# Patient Record
Sex: Male | Born: 1965 | Race: White | Hispanic: No | Marital: Married | Smoking: Never smoker
Health system: Southern US, Community
[De-identification: ages and names within clinical notes are randomized; demographics above are authoritative.]

## PROBLEM LIST (undated history)

## (undated) ENCOUNTER — Ambulatory Visit: Discharge status: Absent without leave | Payer: BLUE CROSS/BLUE SHIELD

## (undated) DIAGNOSIS — J40 Bronchitis, not specified as acute or chronic: Secondary | ICD-10-CM

## (undated) DIAGNOSIS — M1990 Unspecified osteoarthritis, unspecified site: Secondary | ICD-10-CM

## (undated) DIAGNOSIS — M720 Palmar fascial fibromatosis [Dupuytren]: Secondary | ICD-10-CM

## (undated) DIAGNOSIS — J45909 Unspecified asthma, uncomplicated: Secondary | ICD-10-CM

## (undated) HISTORY — PX: RHINOPLASTY: SUR1284

## (undated) HISTORY — DX: Palmar fascial fibromatosis (dupuytren): M72.0

## (undated) HISTORY — PX: TONSILLECTOMY: SUR1361

---

## 2005-01-12 DIAGNOSIS — E7800 Pure hypercholesterolemia, unspecified: Secondary | ICD-10-CM | POA: Insufficient documentation

## 2005-08-04 DIAGNOSIS — Z8042 Family history of malignant neoplasm of prostate: Secondary | ICD-10-CM | POA: Insufficient documentation

## 2005-08-10 ENCOUNTER — Ambulatory Visit: Payer: Self-pay | Admitting: Unknown Physician Specialty

## 2008-08-03 DIAGNOSIS — M6200 Separation of muscle (nontraumatic), unspecified site: Secondary | ICD-10-CM | POA: Insufficient documentation

## 2010-10-21 ENCOUNTER — Ambulatory Visit: Payer: Self-pay | Admitting: Internal Medicine

## 2011-04-29 ENCOUNTER — Ambulatory Visit: Payer: Self-pay

## 2011-05-16 ENCOUNTER — Ambulatory Visit: Payer: Self-pay

## 2012-01-07 ENCOUNTER — Ambulatory Visit: Payer: Self-pay | Admitting: Family Medicine

## 2012-02-12 ENCOUNTER — Ambulatory Visit: Payer: Self-pay | Admitting: Emergency Medicine

## 2012-07-30 ENCOUNTER — Ambulatory Visit: Payer: Self-pay | Admitting: Family Medicine

## 2014-01-30 ENCOUNTER — Ambulatory Visit: Payer: Self-pay | Admitting: Physician Assistant

## 2014-02-14 ENCOUNTER — Ambulatory Visit: Payer: Self-pay | Admitting: Physician Assistant

## 2014-03-21 ENCOUNTER — Ambulatory Visit: Payer: Self-pay | Admitting: Family Medicine

## 2014-07-26 ENCOUNTER — Other Ambulatory Visit: Payer: Self-pay

## 2014-07-26 ENCOUNTER — Ambulatory Visit
Admission: EM | Admit: 2014-07-26 | Discharge: 2014-07-26 | Disposition: A | Discharge status: Home / Self Care | Payer: 59 | Attending: Internal Medicine | Admitting: Internal Medicine

## 2014-07-26 ENCOUNTER — Ambulatory Visit: Payer: 59

## 2014-07-26 DIAGNOSIS — R079 Chest pain, unspecified: Secondary | ICD-10-CM | POA: Diagnosis not present

## 2014-07-26 DIAGNOSIS — J45909 Unspecified asthma, uncomplicated: Secondary | ICD-10-CM | POA: Insufficient documentation

## 2014-07-26 DIAGNOSIS — R091 Pleurisy: Secondary | ICD-10-CM

## 2014-07-26 DIAGNOSIS — R071 Chest pain on breathing: Secondary | ICD-10-CM

## 2014-07-26 HISTORY — DX: Unspecified asthma, uncomplicated: J45.909

## 2014-07-26 MED ORDER — IPRATROPIUM-ALBUTEROL 0.5-2.5 (3) MG/3ML IN SOLN
3.0000 mL | Freq: Once | RESPIRATORY_TRACT | Status: AC
  Administered 2014-07-26: 3 mL via RESPIRATORY_TRACT

## 2014-07-26 NOTE — ED Notes (Signed)
Started yesterday afternoon noticing "breathing heavy and hurts my left lung when I take a breath". "Feels the same as when I had pneumonia". Denies trauma. Pain is left lateral rib/chest. Denies sweating and does not radiate.

## 2014-07-26 NOTE — ED Provider Notes (Signed)
CSN: [id number]     Arrival date & time 07/26/14  0827 History   First MD Initiated Contact with Patient 07/26/14 [phone]     Chief Complaint  Patient presents with  . Chest Pain   HPI Patient is a 49 year old gentleman who presents with onset of left lower chest discomfort, about a 2/10, that he notices when he takes a deep breath. He had similar symptoms several months ago, when he had walking pneumonia.  Constant discomfort in the last day or so.  No trauma recalled.  Feels fine otherwise, no fever, no malaise.  Not coughing.  Past Medical History  Diagnosis Date  . Asthma    Past Surgical History  Procedure Laterality Date  . Tonsillectomy    . Rhinoplasty     Family History  Problem Relation Age of Onset  . Emphysema Mother    History  Substance Use Topics  . Smoking status: Never Smoker   . Smokeless tobacco: Not on file  . Alcohol Use: No    Review of Systems  All other systems reviewed and are negative.   Allergies  Review of patient's allergies indicates no known allergies.  Home Medications   Prior to Admission medications   Medication Sig Start Date End Date Taking? Authorizing Provider  Multiple Vitamin (MULTIVITAMIN) capsule Take 1 capsule by mouth daily.   Yes Historical Provider, MD   BP 102/73 mmHg  Pulse 84  Temp(Src) 98.4 F (36.9 C) (Oral)  Resp 16  Ht 5\' 6"  (1.676 m)  Wt 180 lb (81.647 kg)  BMI 29.07 kg/m2  SpO2 96% Physical Exam  Constitutional: He is oriented to person, place, and time. No distress.  Alert, nicely groomed  HENT:  Head: Atraumatic.  Eyes:  Conjugate gaze, no eye redness/drainage  Neck: Neck supple.  Cardiovascular: Normal rate and regular rhythm.   No murmur heard. Pulmonary/Chest: No respiratory distress. He has no wheezes. He has no rales.  Lungs clear, symmetric breath sounds  Abdominal: Soft. He exhibits no distension.  Musculoskeletal: Normal range of motion.  No leg swelling  Neurological: He is alert and  oriented to person, place, and time.  Skin: Skin is warm and dry.  No cyanosis  Nursing note and vitals reviewed.   ED Course  Procedures   ED ECG REPORT   Date: 07/26/2014  EKG Time: 7:37 PM  Rate: 80  Rhythm: normal sinus rhythm   Axis: 71  Intervals:no conduction delay  ST&T Change: no acute ST, T wave change  Narrative Interpretation: normal sinus rhythm, normal ECG I agree with the computer interpretation that ecg is normal today     Dg Chest 2 View  07/26/2014   CLINICAL DATA:  Left posterior mid chest pain when taking breath in, history of pneumonia in January of this year  EXAM: CHEST  2 VIEW  COMPARISON:  Chest x-ray of 03/21/2014  FINDINGS: No active infiltrate or effusion is seen. Mediastinal and hilar contours are unremarkable. The heart is within normal limits in size. No bony abnormality is seen.  IMPRESSION: No active cardiopulmonary disease.   Electronically Signed   By: Ivar Drape M.D.   On: 07/26/2014 09:47    duoneb breathing treatment was given at the urgent care, with minimal change in pleuritic chest discomfort..  MDM   1. Chest pain on respiration   2. Pleurisy without effusion    Differential dx includes pleurisy, musculoskeletal sources.  Trial of ibuprofen otc.  Recheck for persistent/worsening sx's.  Sherlene Shams, MD 07/26/14 [phone]

## 2014-07-26 NOTE — Discharge Instructions (Signed)
Your chest discomfort today seems most likely to be related to mild pleurisy (inflammation of covering around lung, often viral), or musculoskeletal irritation. Try taking advil for the next several days, up to iii tablets otc, 3-4 times daily. Recheck for worsening breathlessness, new fever >100.5, sustained/severe chest pain. Chest xray and ECG at the urgent care today were normal.

## 2014-12-22 ENCOUNTER — Ambulatory Visit
Admission: EM | Admit: 2014-12-22 | Discharge: 2014-12-22 | Disposition: A | Discharge status: Home / Self Care | Payer: 59 | Attending: Family Medicine | Admitting: Family Medicine

## 2014-12-22 ENCOUNTER — Ambulatory Visit (INDEPENDENT_AMBULATORY_CARE_PROVIDER_SITE_OTHER): Payer: 59

## 2014-12-22 ENCOUNTER — Encounter: Payer: Self-pay | Admitting: Emergency Medicine

## 2014-12-22 DIAGNOSIS — J40 Bronchitis, not specified as acute or chronic: Secondary | ICD-10-CM

## 2014-12-22 DIAGNOSIS — R059 Cough, unspecified: Secondary | ICD-10-CM

## 2014-12-22 DIAGNOSIS — R05 Cough: Secondary | ICD-10-CM

## 2014-12-22 DIAGNOSIS — J9811 Atelectasis: Secondary | ICD-10-CM

## 2014-12-22 MED ORDER — AZITHROMYCIN 500 MG PO TABS: ORAL_TABLET | ORAL | Status: DC

## 2014-12-22 MED ORDER — ALBUTEROL SULFATE HFA 108 (90 BASE) MCG/ACT IN AERS: 2.0000 | INHALATION_SPRAY | Freq: Four times a day (QID) | RESPIRATORY_TRACT | Status: DC | PRN

## 2014-12-22 MED ORDER — PREDNISONE 10 MG (21) PO TBPK: ORAL_TABLET | ORAL | Status: DC

## 2014-12-22 MED ORDER — HYDROCOD POLST-CPM POLST ER 10-8 MG/5ML PO SUER: 5.0000 mL | Freq: Two times a day (BID) | ORAL | Status: DC | PRN

## 2014-12-22 NOTE — Discharge Instructions (Signed)
Cough, Adult Coughing is a reflex that clears your throat and your airways. Coughing helps to heal and protect your lungs. It is normal to cough occasionally, but a cough that happens with other symptoms or lasts a long time may be a sign of a condition that needs treatment. A cough may last only 2-3 weeks (acute), or it may last longer than 8 weeks (chronic). CAUSES Coughing is commonly caused by:  Breathing in substances that irritate your lungs.  A viral or bacterial respiratory infection.  Allergies.  Asthma.  Postnasal drip.  Smoking.  Acid backing up from the stomach into the esophagus (gastroesophageal reflux).  Certain medicines.  Chronic lung problems, including COPD (or rarely, lung cancer).  Other medical conditions such as heart failure. HOME CARE INSTRUCTIONS  Pay attention to any changes in your symptoms. Take these actions to help with your discomfort:  Take medicines only as told by your health care provider.  If you were prescribed an antibiotic medicine, take it as told by your health care provider. Do not stop taking the antibiotic even if you start to feel better.  Talk with your health care provider before you take a cough suppressant medicine.  Drink enough fluid to keep your urine clear or pale yellow.  If the air is dry, use a cold steam vaporizer or humidifier in your bedroom or your home to help loosen secretions.  Avoid anything that causes you to cough at work or at home.  If your cough is worse at night, try sleeping in a semi-upright position.  Avoid cigarette smoke. If you smoke, quit smoking. If you need help quitting, ask your health care provider.  Avoid caffeine.  Avoid alcohol.  Rest as needed. SEEK MEDICAL CARE IF:   You have new symptoms.  You cough up pus.  Your cough does not get better after 2-3 weeks, or your cough gets worse.  You cannot control your cough with suppressant medicines and you are losing sleep.  You  develop pain that is getting worse or pain that is not controlled with pain medicines.  You have a fever.  You have unexplained weight loss.  You have night sweats. SEEK IMMEDIATE MEDICAL CARE IF:  You cough up blood.  You have difficulty breathing.  Your heartbeat is very fast.   This information is not intended to replace advice given to you by your health care provider. Make sure you discuss any questions you have with your health care provider.   Document Released: 06/27/2010 Document Revised: 09/19/2014 Document Reviewed: 03/07/2014 Elsevier Interactive Patient Education 2016 Else[employer]  Atelectasis, Adult Atelectasis is a collapse of the small air sacs in the lungs (alveoli). When this occurs, all or part of a lung collapses and becomes airless. It can be caused by various things and is a common problem after surgery. The severity of atelectasis will vary depending on the size of the area involved and the underlying cause of the condition. CAUSES  There are multiple causes for atelectasis:   Shallow breathing, particularly if there is an injury to your chest wall or abdomen that makes it painful to take a deep breath. This commonly occurs after surgery.  Obstruction of your airways (bronchi or bronchioles). This may be caused by a buildup of mucus (mucus plug), tumors, blood clots (pulmonary embolus), or inhaled foreign bodies. Mucus plugs occur when the lungs do not expand enough to get rid of mucus.  Outside pressure on the lung. This may be caused by  tumors, fluid (pleural effusion), or a leakage of air between the lung and rib cage (pneumothorax).   Infections such as pneumonia.  Scarring in lung tissue left over from previous infection or injury.  Some diseases such as cystic fibrosis. SIGNS AND SYMPTOMS  Often, atelectasis will have no symptoms. When symptoms occur, they include:  Shortness of breath.   Bluish color to your nails, lips, or mouth  (cyanosis). DIAGNOSIS  Your health care provider may suspect atelectasis based on symptoms and physical findings. A chest X-ray may be done to confirm the diagnosis. More specialized X-ray exams are sometimes required.  TREATMENT  Treatment will depend on the cause of the atelectasis. Treatment may include:  Purposeful coughing to loosen mucus plugs in the lungs.  Chest physiotherapy. This consists of clapping or percussion on the chest over the lungs to further loosen mucus plugs.  Postural drainage techniques. This involves positioning your body so your head is lower than your chest. [place] relaxed deep breathing whenever you are sitting down. A good technique is to take a few relaxed deep breaths each time a commercial comes on if you are watching television.  If you were given a deep breathing device (such as an incentive spirometer) or a mucus clearance device, use this regularly as directed by your health care provider.  Try to cough several times a day as directed by your health care provider.  Perform any chest physiotherapy or postural drainage techniques as directed by your health care provider. If necessary, have someone (such as a family member) assist you with these techniques.  When lying down, lie on the unaffected side to encourage mucus drainage.  Stay physically active as much as possible. SEEK IMMEDIATE MEDICAL CARE IF:   You develop increasing problems with your breathing.   You develop severe chest pain.   You develop severe coughing, or you cough up blood.   You have a fever or persistent symptoms for more than 2-3 days.   You have a fever and your symptoms suddenly get worse.  MAKE SURE YOU:  Understand these instructions.  Will watch your condition.  Will get help right away if you are not doing well or get worse.   This information is not intended to replace advice given to you by your health care provider. Make sure  you discuss any questions you have with your health care provider.   Document Released: 12/29/2004 Document Revised: 01/19/2014 Document Reviewed: 07/06/2012 Elsevier Interactive Patient Education 2016 [employer].  How to Use an Inhaler Using your inhaler correctly is very important. Good technique will make sure that the medicine reaches your lungs.  HOW TO USE AN INHALER:  Take the cap off the inhaler.  If this is the first time using your inhaler, you need to prime it. Shake the inhaler for 5 seconds. Release four puffs into the air, away from your face. Ask your doctor for help if you have questions.  Shake the inhaler for 5 seconds.  Turn the inhaler so the bottle is above the mouthpiece.  Put your pointer finger on top of the bottle. Your thumb holds the bottom of the inhaler.  Open your mouth.  Either hold the inhaler away from your mouth (the width of 2 fingers) or place your lips tightly around the mouthpiece. Ask your doctor which way to use your inhaler.  Breathe out as much air as possible.  Breathe in and push down on the bottle 1 time to  release the medicine. You will feel the medicine go in your mouth and throat.  Continue to take a deep breath in very slowly. Try to fill your lungs.  After you have breathed in completely, hold your breath for 10 seconds. This will help the medicine to settle in your lungs. If you cannot hold your breath for 10 seconds, hold it for as long as you can before you breathe out.  Breathe out slowly, through pursed lips. Whistling is an example of pursed lips.  If your doctor has told you to take more than 1 puff, wait at least 15-30 seconds between puffs. This will help you get the best results from your medicine. Do not use the inhaler more than your doctor tells you to.  Put the cap back on the inhaler.  Follow the directions from your doctor or from the inhaler package about cleaning the inhaler. If you use more than one inhaler,  ask your doctor which inhalers to use and what order to use them in. Ask your doctor to help you figure out when you will need to refill your inhaler.  If you use a steroid inhaler, always rinse your mouth with water after your last puff, gargle and spit out the water. Do not swallow the water. GET HELP IF:  The inhaler medicine only partially helps to stop wheezing or shortness of breath.  You are having trouble using your inhaler.  You have some increase in thick spit (phlegm). GET HELP RIGHT AWAY IF:  The inhaler medicine does not help your wheezing or shortness of breath or you have tightness in your chest.  You have dizziness, headaches, or fast heart rate.  You have chills, fever, or night sweats.  You have a large increase of thick spit, or your thick spit is bloody. MAKE SURE YOU:   Understand these instructions.  Will watch your condition.  Will get help right away if you are not doing well or get worse.   This information is not intended to replace advice given to you by your health care provider. Make sure you discuss any questions you have with your health care provider.   Document Released: 10/08/2007 Document Revised: 10/19/2012 Document Reviewed: 07/28/2012 Elsevier Interactive Patient Education 2016 Elsevier Inc.  Upper Respiratory Infection, Adult Most upper respiratory infections (URIs) are caused by a virus. A URI affects the nose, throat, and upper air passages. The most common type of URI is often called "the common cold." HOME CARE   Take medicines only as told by your doctor.  Gargle warm saltwater or take cough drops to comfort your throat as told by your doctor.  Use a warm mist humidifier or inhale steam from a shower to increase air moisture. This may make it easier to breathe.  Drink enough fluid to keep your pee (urine) clear or pale yellow.  Eat soups and other clear broths.  Have a healthy diet.  Rest as needed.  Go back to work when  your fever is gone or your doctor says it is okay.  You may need to stay home longer to avoid giving your URI to others.  You can also wear a face mask and wash your hands often to prevent spread of the virus.  Use your inhaler more if you have asthma.  Do not use any tobacco products, including cigarettes, chewing tobacco, or electronic cigarettes. If you need help quitting, ask your doctor. GET HELP IF:  You are getting worse, not better.  Your symptoms are not helped by medicine.  You have chills.  You are getting more short of breath.  You have brown or red mucus.  You have yellow or brown discharge from your nose.  You have pain in your face, especially when you bend forward.  You have a fever.  You have puffy (swollen) neck glands.  You have pain while swallowing.  You have white areas in the back of your throat. GET HELP RIGHT AWAY IF:   You have very bad or constant:  Headache.  Ear pain.  Pain in your forehead, behind your eyes, and over your cheekbones (sinus pain).  Chest pain.  You have long-lasting (chronic) lung disease and any of the following:  Wheezing.  Long-lasting cough.  Coughing up blood.  A change in your usual mucus.  You have a stiff neck.  You have changes in your:  Vision.  Hearing.  Thinking.  Mood. MAKE SURE YOU:   Understand these instructions.  Will watch your condition.  Will get help right away if you are not doing well or get worse.   This information is not intended to replace advice given to you by your health care provider. Make sure you discuss any questions you have with your health care provider.   Document Released: 06/17/2007 Document Revised: 05/15/2014 Document Reviewed: 04/05/2013 Elsevier Interactive Patient Education [employer].

## 2014-12-22 NOTE — ED Notes (Signed)
Patient c/o cough off and on for 6 weeks.

## 2014-12-22 NOTE — ED Provider Notes (Addendum)
CSN: [id number]     Arrival date & time 12/22/14  1031 History   First MD Initiated Contact with Patient 12/22/14 1154    Nurses notes were reviewed. Chief Complaint  Patient presents with  . Cough   Patient is here because of a cough for over 4 weeks. He states he finished a prescription for Omnicef about 1-2 weeks ago but has continued to have a recurrent cough that is worse at night. He has a history of exercise induced asthma he does not smoke. He states that the cough is usually nonproductive but has been very aggravating keeping him up at night as well.  (Consider location/radiation/quality/duration/timing/severity/associated sxs/prior Treatment) Patient is a 49 y.o. male presenting with cough. The history is provided by the patient. No language interpreter was used.  Cough Cough characteristics:  Productive and non-productive Sputum characteristics:  Green and gray Severity:  Moderate Duration:  4 weeks Timing:  Constant Progression:  Worsening Chronicity:  New Context: upper respiratory infection   Relieved by:  Nothing Ineffective treatments:  Cough suppressants Associated symptoms: rhinorrhea, shortness of breath, sinus congestion and wheezing   Associated symptoms: no chills and no fever     Past Medical History  Diagnosis Date  . Asthma    Past Surgical History  Procedure Laterality Date  . Tonsillectomy    . Rhinoplasty     Family History  Problem Relation Age of Onset  . Emphysema Mother    Social History  Substance Use Topics  . Smoking status: Never Smoker   . Smokeless tobacco: None  . Alcohol Use: No    Review of Systems  Constitutional: Negative for fever and chills.  HENT: Positive for rhinorrhea.   Respiratory: Positive for cough, shortness of breath and wheezing.     Allergies  Review of patient's allergies indicates no known allergies.  Home Medications   Prior to Admission medications   Medication Sig Start Date End Date Taking?  Authorizing Provider  albuterol (PROVENTIL HFA;VENTOLIN HFA) 108 (90 BASE) MCG/ACT inhaler Inhale 2 puffs into the lungs every 6 (six) hours as needed for wheezing or shortness of breath. 12/22/14   Frederich Cha, MD  azithromycin (ZITHROMAX) 500 MG tablet 1 tablet daily for the next 5 days 12/22/14   Frederich Cha, MD  chlorpheniramine-HYDROcodone [facility]) 10-8 MG/5ML SUER Take 5 mLs by mouth every 12 (twelve) hours as needed for cough. 12/22/14   Frederich Cha, MD  Multiple Vitamin (MULTIVITAMIN) capsule Take 1 capsule by mouth daily.    Historical Provider, MD  predniSONE (STERAPRED UNI-PAK 21 TAB) 10 MG (21) TBPK tablet Sig 6 tablet day 1, 5 tablets day 2, 4 tablets day 3,,3tablets day 4, 2 tablets day 5, 1 tablet day 6 take all tablets orally 12/22/14   Frederich Cha, MD   Meds Ordered and Administered this Visit  Medications - No data to display  BP 108/60 mmHg  Pulse 69  Temp(Src) 97 F (36.1 C) (Tympanic)  Resp 17  Ht 5\' 6"  (1.676 m)  Wt 180 lb (81.647 kg)  BMI 29.07 kg/m2  SpO2 98% No data found.   Physical Exam  Constitutional: He is oriented to person, place, and time. He appears well-developed and well-nourished.  HENT:  Head: Normocephalic and atraumatic.  Eyes: Conjunctivae are normal. Pupils are equal, round, and reactive to light.  Neck: Normal range of motion.  Cardiovascular: Normal rate, regular rhythm and normal heart sounds.   Pulmonary/Chest: Effort normal and breath sounds normal.  Musculoskeletal: Normal  range of motion.  Neurological: He is alert and oriented to person, place, and time.  Skin: Skin is warm and dry.  Psychiatric: He has a normal mood and affect.  Vitals reviewed.   ED Course  Procedures (including critical care time)  Labs Review Labs Reviewed - No data to display  Imaging Review Dg Chest 2 View  12/22/2014  CLINICAL DATA:  Cough for 6 weeks EXAM: CHEST  2 VIEW COMPARISON:  July 26, 2014 FINDINGS: There is mild left base  atelectatic change. There is no edema or consolidation. The heart size and pulmonary vascularity are normal. No adenopathy. No bone lesions. IMPRESSION: Slight left base atelectasis.  Lungs elsewhere clear. Electronically Signed   By: Lowella Grip III M.D.   On: 12/22/2014 13:35     Visual Acuity Review  Right Eye Distance:   Left Eye Distance:   Bilateral Distance:    Right Eye Near:   Left Eye Near:    Bilateral Near:         MDM   1. Bronchitis   2. Cough   3. Atelectasis      We'll place patient due to the atelectasis and persistent cough on Zithromax especially since he's been on Omnicef earlier, prednisone 6 day course, albuterol inhaler to be encouraged to use an test next 1 teaspoon twice a day. Recommend follow-up with PCP in 3-4 weeks for possible repeat chest x-ray and reevaluation.   Patient declines to albuterol inhaler. States that he's used before it makes him feel worse and is also cause some CNS side effects as well for him.    Frederich Cha, MD 12/22/14 1349  Frederich Cha, MD 12/22/14 [phone]

## 2015-01-02 DIAGNOSIS — M720 Palmar fascial fibromatosis [Dupuytren]: Secondary | ICD-10-CM | POA: Insufficient documentation

## 2015-04-20 ENCOUNTER — Encounter: Payer: Self-pay | Admitting: Family Medicine

## 2015-04-20 ENCOUNTER — Ambulatory Visit (INDEPENDENT_AMBULATORY_CARE_PROVIDER_SITE_OTHER): Payer: BLUE CROSS/BLUE SHIELD | Admitting: Family Medicine

## 2015-04-20 VITALS — BP 104/70 | HR 76 | Temp 97.8°F | Resp 16 | Ht 66.0 in | Wt 190.4 lb

## 2015-04-20 DIAGNOSIS — K219 Gastro-esophageal reflux disease without esophagitis: Secondary | ICD-10-CM | POA: Insufficient documentation

## 2015-04-20 DIAGNOSIS — J45909 Unspecified asthma, uncomplicated: Secondary | ICD-10-CM | POA: Insufficient documentation

## 2015-04-20 DIAGNOSIS — R350 Frequency of micturition: Secondary | ICD-10-CM | POA: Diagnosis not present

## 2015-04-20 DIAGNOSIS — J309 Allergic rhinitis, unspecified: Secondary | ICD-10-CM | POA: Insufficient documentation

## 2015-04-20 DIAGNOSIS — M722 Plantar fascial fibromatosis: Secondary | ICD-10-CM | POA: Insufficient documentation

## 2015-04-20 LAB — POCT URINALYSIS DIPSTICK
Bilirubin, UA: NEGATIVE
Blood, UA: NEGATIVE
Glucose, UA: NEGATIVE
Ketones, UA: NEGATIVE
Leukocytes, UA: NEGATIVE
Nitrite, UA: NEGATIVE
Protein, UA: NEGATIVE
Spec Grav, UA: >=1.03
Urobilinogen, UA: 0.2
pH, UA: 5

## 2015-04-20 MED ORDER — SULFAMETHOXAZOLE-TRIMETHOPRIM 800-160 MG PO TABS: 1.0000 | ORAL_TABLET | Freq: Two times a day (BID) | ORAL | Status: DC

## 2015-04-20 NOTE — Patient Instructions (Signed)
We will call you with the lab result.

## 2015-04-20 NOTE — Progress Notes (Signed)
Subjective:     Patient ID: Micheal Yoder, male   DOB: 1965/04/07, 50 y.o.   MRN: [id number]  HPI  Chief Complaint  Patient presents with  . Urinary Frequency    Patient comes in office today to address urinary frequency and urgency for the past 4 nights. Patient states that he has urinary frequency only during the evenings when he is laying down. Paitent states that when he voids it is very little. Patient denies any associated symptoms such as dysuria, hematuria, or incontinence.     Reports usual pattern is getting up 0-1 during the night. States he is married and monogamous. Paternal hx of prostate cancer at 67.   Review of Systems     Objective:   Physical Exam  Constitutional: He appears well-developed and well-nourished.  Genitourinary:  Prostate firm, non-tender, not enlarged.       Assessment:    1. Urinary frequency - POCT urinalysis dipstick - sulfamethoxazole-trimethoprim (BACTRIM DS,SEPTRA DS) 800-160 MG tablet; Take 1 tablet by mouth 2 (two) times daily.  Dispense: 20 tablet; Refill: 0 - PSA    Plan:    Further f/u pending lab results.

## 2015-04-23 LAB — PSA: Prostate Specific Ag, Serum: 1 ng/mL (ref 0.0–4.0)

## 2015-07-06 ENCOUNTER — Encounter: Payer: Self-pay | Admitting: *Deleted

## 2015-07-06 ENCOUNTER — Ambulatory Visit (INDEPENDENT_AMBULATORY_CARE_PROVIDER_SITE_OTHER): Payer: BLUE CROSS/BLUE SHIELD

## 2015-07-06 ENCOUNTER — Ambulatory Visit
Admission: EM | Admit: 2015-07-06 | Discharge: 2015-07-06 | Disposition: A | Discharge status: Home / Self Care | Payer: BLUE CROSS/BLUE SHIELD | Attending: Family Medicine | Admitting: Family Medicine

## 2015-07-06 DIAGNOSIS — R911 Solitary pulmonary nodule: Secondary | ICD-10-CM | POA: Diagnosis not present

## 2015-07-06 DIAGNOSIS — J40 Bronchitis, not specified as acute or chronic: Secondary | ICD-10-CM

## 2015-07-06 DIAGNOSIS — J4520 Mild intermittent asthma, uncomplicated: Secondary | ICD-10-CM | POA: Diagnosis not present

## 2015-07-06 DIAGNOSIS — R05 Cough: Secondary | ICD-10-CM | POA: Diagnosis not present

## 2015-07-06 DIAGNOSIS — R059 Cough, unspecified: Secondary | ICD-10-CM

## 2015-07-06 MED ORDER — BENZONATATE 100 MG PO CAPS: 100.0000 mg | ORAL_CAPSULE | Freq: Three times a day (TID) | ORAL | Status: DC

## 2015-07-06 MED ORDER — PREDNISONE 10 MG PO TABS: ORAL_TABLET | ORAL | Status: DC

## 2015-07-06 MED ORDER — LEVOFLOXACIN 500 MG PO TABS: 500.0000 mg | ORAL_TABLET | Freq: Every day | ORAL | Status: DC

## 2015-07-06 NOTE — ED Notes (Signed)
Patient states that he has had a cough and congestion for about 3 weeks

## 2015-07-06 NOTE — ED Provider Notes (Signed)
CSN: [id number]     Arrival date & time 07/06/15  0910 History   First MD Initiated Contact with Patient 07/06/15 [phone]     Chief Complaint  Patient presents with  . Cough   (Consider location/radiation/quality/duration/timing/severity/associated sxs/prior Treatment) Patient is a 50 y.o. male presenting with cough. The history is provided by the patient.  Cough Cough characteristics:  Productive Sputum characteristics:  Yellow Severity:  Moderate Onset quality:  Gradual Timing:  Intermittent Progression:  Worsening Smoker: no   Context: upper respiratory infection and weather changes   Relieved by:  Nothing Worsened by:  Nothing tried Associated symptoms: wheezing   Associated symptoms: no chest pain, no chills, no diaphoresis, no ear pain, no fever, no sinus congestion and no sore throat     Past Medical History  Diagnosis Date  . Asthma   . Dupuytren's disease    Past Surgical History  Procedure Laterality Date  . Tonsillectomy    . Rhinoplasty     Family History  Problem Relation Age of Onset  . Emphysema Mother    Social History  Substance Use Topics  . Smoking status: Never Smoker   . Smokeless tobacco: None  . Alcohol Use: No    Review of Systems  Constitutional: Negative for fever, chills and diaphoresis.  HENT: Positive for congestion and postnasal drip. Negative for ear discharge, ear pain, facial swelling and sore throat.   Eyes: Negative.   Respiratory: Positive for cough and wheezing. Negative for choking and chest tightness.   Cardiovascular: Negative for chest pain.  Gastrointestinal: Negative.   Genitourinary: Negative.   Musculoskeletal: Negative.   Psychiatric/Behavioral: Positive for agitation.       Certain meds    Allergies  Review of patient's allergies indicates no known allergies.  Home Medications   Prior to Admission medications   Medication Sig Start Date End Date Taking? Authorizing Provider  Triamcinolone Acetonide (NASACORT  ALLERGY 24HR NA) Place into the nose.   Yes Historical Provider, MD  benzonatate (TESSALON) 100 MG capsule Take 1 capsule (100 mg total) by mouth every 8 (eight) hours. 07/06/15   Juline Patch, MD  levofloxacin (LEVAQUIN) 500 MG tablet Take 1 tablet (500 mg total) by mouth daily. 07/06/15   Juline Patch, MD  Multiple Vitamin (MULTIVITAMIN) capsule Take 1 capsule by mouth daily.    Historical Provider, MD  predniSONE (DELTASONE) 10 MG tablet Taper 6,6,6,5,5,5,4,4,3,3,2,2,1,1 07/06/15   Juline Patch, MD  sulfamethoxazole-trimethoprim (BACTRIM DS,SEPTRA DS) 800-160 MG tablet Take 1 tablet by mouth 2 (two) times daily. 04/20/15   Carmon Ginsberg, PA   Meds Ordered and Administered this Visit  Medications - No data to display  BP 104/63 mmHg  Pulse 79  Temp(Src) 98 F (36.7 C) (Oral)  Ht 5\' 6"  (1.676 m)  Wt 180 lb (81.647 kg)  BMI 29.07 kg/m2  SpO2 99% No data found.   Physical Exam  Constitutional: He appears well-developed and well-nourished.  HENT:  Head: Normocephalic.  Right Ear: Tympanic membrane, external ear and ear canal normal.  Left Ear: Tympanic membrane, external ear and ear canal normal.  Nose: Nose normal.  Mouth/Throat: Oropharynx is clear and moist. No oropharyngeal exudate.  Eyes: Conjunctivae and EOM are normal. Pupils are equal, round, and reactive to light.  Neck: Normal range of motion. Neck supple. No JVD present.  Cardiovascular: Normal rate and normal heart sounds.  Exam reveals no gallop and no friction rub.   No murmur heard. Pulmonary/Chest: Effort normal and breath  sounds normal. He has no wheezes. He has no rales.  increased E?I  Abdominal: There is no tenderness. There is no rebound.  Lymphadenopathy:    He has no cervical adenopathy.  Skin: No rash noted. No erythema.  Psychiatric: He has a normal mood and affect.  Nursing note and vitals reviewed.   ED Course  Procedures (including critical care time)  Labs Review Labs Reviewed - No data to  display  Imaging Review Dg Chest 2 View  07/06/2015  CLINICAL DATA:  Cough and congestion for 3 weeks not getting better, pneumonia twice last year, history asthma EXAM: CHEST  2 VIEW COMPARISON:  12/22/2014 FINDINGS: Normal heart size, mediastinal contours, and pulmonary vascularity. Lungs clear. No pleural effusion or pneumothorax. Asymmetric density at lower lateral RIGHT chest, question related to osseous structures but nodule not excluded. Remaining osseous structures normal appearance. IMPRESSION: Vague opacity at lateral lower RIGHT chest cannot exclude pulmonary nodule; CT chest recommended to exclude pulmonary nodule. Electronically Signed   By: Lavonia Dana M.D.   On: 07/06/2015 10:28     Visual Acuity Review  Right Eye Distance:   Left Eye Distance:   Bilateral Distance:    Right Eye Near:   Left Eye Near:    Bilateral Near:         MDM   1. Bronchitis   2. Cough   3. Airway hyperreactivity, mild intermittent, uncomplicated   4. Pulmonary nodule    Meds ordered this encounter  Medications  . Triamcinolone Acetonide (NASACORT ALLERGY 24HR NA)    Sig: Place into the nose.  . levofloxacin (LEVAQUIN) 500 MG tablet    Sig: Take 1 tablet (500 mg total) by mouth daily.    Dispense:  10 tablet    Refill:  0  . predniSONE (DELTASONE) 10 MG tablet    Sig: Taper 6,6,6,5,5,5,4,4,3,3,2,2,1,1    Dispense:  53 tablet    Refill:  1  . benzonatate (TESSALON) 100 MG capsule    Sig: Take 1 capsule (100 mg total) by mouth every 8 (eight) hours.    Dispense:  21 capsule    Refill:  0    Juline Patch, MD 07/06/15 1045

## 2015-07-10 ENCOUNTER — Other Ambulatory Visit: Payer: Self-pay | Admitting: Family Medicine

## 2015-07-10 DIAGNOSIS — R9389 Abnormal findings on diagnostic imaging of other specified body structures: Secondary | ICD-10-CM

## 2015-07-17 ENCOUNTER — Ambulatory Visit
Admission: RE | Admit: 2015-07-17 | Discharge: 2015-07-17 | Disposition: A | Discharge status: Home / Self Care | Payer: BLUE CROSS/BLUE SHIELD | Source: Ambulatory Visit | Attending: Family Medicine | Admitting: Family Medicine

## 2015-07-17 DIAGNOSIS — R9389 Abnormal findings on diagnostic imaging of other specified body structures: Secondary | ICD-10-CM

## 2015-07-17 DIAGNOSIS — R938 Abnormal findings on diagnostic imaging of other specified body structures: Secondary | ICD-10-CM | POA: Diagnosis present

## 2015-08-14 ENCOUNTER — Encounter: Payer: Self-pay | Admitting: *Deleted

## 2015-08-14 ENCOUNTER — Ambulatory Visit
Admission: EM | Admit: 2015-08-14 | Discharge: 2015-08-14 | Disposition: A | Discharge status: Home / Self Care | Payer: BLUE CROSS/BLUE SHIELD | Attending: Emergency Medicine | Admitting: Emergency Medicine

## 2015-08-14 DIAGNOSIS — H6591 Unspecified nonsuppurative otitis media, right ear: Secondary | ICD-10-CM | POA: Diagnosis not present

## 2015-08-14 DIAGNOSIS — H6505 Acute serous otitis media, recurrent, left ear: Secondary | ICD-10-CM

## 2015-08-14 DIAGNOSIS — H6092 Unspecified otitis externa, left ear: Secondary | ICD-10-CM

## 2015-08-14 MED ORDER — CIPROFLOXACIN HCL 0.2 % OT SOLN: 0.2000 mL | Freq: Two times a day (BID) | OTIC | 0 refills | Status: AC

## 2015-08-14 MED ORDER — AMOXICILLIN-POT CLAVULANATE 875-125 MG PO TABS: 1.0000 | ORAL_TABLET | Freq: Two times a day (BID) | ORAL | 0 refills | Status: DC

## 2015-08-14 NOTE — ED Provider Notes (Signed)
CSN: [id number]     Arrival date & time 08/14/15  1859 History   First MD Initiated Contact with Patient 08/14/15 1945     Chief Complaint  Patient presents with  . 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 Asthma   . Dupuytren's disease    Past Surgical History:  Procedure Laterality Date  . RHINOPLASTY    . TONSILLECTOMY     Family History  Problem Relation Age of Onset  . Emphysema Mother    Social History  Substance Use Topics  . Smoking status: Never Smoker  . Smokeless tobacco: Never Used  . Alcohol use No    Review of Systems  Constitutional: Negative for chills and fever.  HENT: Positive for ear discharge and ear pain. Negative for congestion, dental problem, drooling, facial swelling, hearing loss, mouth sores, nosebleeds, postnasal drip, rhinorrhea, sinus pressure, sneezing, sore throat, tinnitus and trouble swallowing.   Eyes: Negative for pain and visual disturbance.  Respiratory: Negative for cough, shortness of breath and wheezing.   Cardiovascular: Negative for chest pain and palpitations.  Gastrointestinal: Negative for abdominal pain and vomiting.  Genitourinary: Negative for dysuria and hematuria.  Musculoskeletal: Negative for arthralgias, back pain and gait problem.  Skin: Negative for color change and rash.  Allergic/Immunologic: Positive for environmental allergies. Negative for food allergies.  Neurological: Negative for dizziness, seizures, syncope, facial asymmetry, light-headedness, numbness and headaches.   Psychiatric/Behavioral: Negative for sleep disturbance.  All other systems reviewed and are negative.   Allergies  Review of patient's allergies indicates no known allergies.  Home Medications   Prior to Admission medications   Medication Sig Start Date End Date Taking? Authorizing Provider  Multiple Vitamin (MULTIVITAMIN) capsule Take 1 capsule by mouth daily.   Yes Historical Provider, MD  Triamcinolone Acetonide (NASACORT ALLERGY 24HR NA) Place into the nose.   Yes Historical Provider, MD  amoxicillin-clavulanate (AUGMENTIN) 875-125 MG tablet Take 1 tablet by mouth every 12 (twelve) hours. 08/14/15   Olen Cordial, NP  Ciprofloxacin HCl 0.2 % otic solution Place 0.2 mLs into the left ear 2 (two) times daily. 08/14/15 08/21/15  Olen Cordial, NP   Meds Ordered and Administered this Visit  Medications - No data to display  BP 117/67 (BP Location: Left Arm)   Pulse 68   Temp 98.3 F (36.8 C) (Oral)   Resp 18   Ht 5\' 6"  (1.676 m)   Wt 180 lb (81.6 kg)   SpO2 100%   BMI 29.05 kg/m  No data found.   Physical Exam  Constitutional: He is oriented to person, place, and time. He appears well-developed and well-nourished. He is cooperative.  Non-toxic appearance. He does not have a sickly appearance. He does not appear ill. No distress.  HENT:  Head: Normocephalic and atraumatic.  Right Ear: Hearing, external ear and ear canal normal. A middle ear effusion is present.  Left Ear: Hearing and external ear normal. There is drainage and swelling. Tympanic membrane is injected, scarred, erythematous and bulging. A middle ear effusion is present.  Nose: Mucosal edema and rhinorrhea present. No nose  lacerations, sinus tenderness, nasal deformity, septal deviation or nasal septal hematoma. No epistaxis.  No foreign bodies. Right sinus exhibits no maxillary sinus tenderness and no frontal sinus tenderness. Left sinus exhibits no maxillary sinus tenderness and no frontal sinus tenderness.   Mouth/Throat: Uvula is midline and mucous membranes are normal. Mucous membranes are not pale, not dry and not cyanotic. He does not have dentures. No oral lesions. No trismus in the jaw. Normal dentition. No dental abscesses, uvula swelling, lacerations or dental caries. Posterior oropharyngeal edema and posterior oropharyngeal erythema present. No oropharyngeal exudate or tonsillar abscesses. Tonsils are 0 on the right. Tonsils are 0 on the left. No tonsillar exudate.  Left external auditory canal swollen erythema white discharge opaque coating walls; unable to visualize entire TM left but what I can see erythematous bulging with some scarring opaque air fluid level; right TM with air fluid level clear; bilateral nasal turbinates edema/erythema clear discharge; cobblestoning posterior pharynx; bilateral allergic shiners  Eyes: Conjunctivae, EOM and lids are normal. Pupils are equal, round, and reactive to light. Right eye exhibits no chemosis, no discharge, no exudate and no hordeolum. No foreign body present in the right eye. Left eye exhibits no chemosis, no discharge, no exudate and no hordeolum. No foreign body present in the left eye. Right conjunctiva is not injected. Right conjunctiva has no hemorrhage. Left conjunctiva is not injected. Left conjunctiva has no hemorrhage. No scleral icterus. Right eye exhibits normal extraocular motion and no nystagmus. Left eye exhibits normal extraocular motion and no nystagmus. Right pupil is round and reactive. Left pupil is round and reactive. Pupils are equal.  Neck: Normal range of motion. Neck supple. No tracheal tenderness, no spinous process tenderness and no muscular tenderness present. No neck rigidity. No tracheal deviation, no edema, no erythema and normal range of motion present. No thyromegaly present.  Cardiovascular: Normal rate, regular rhythm and normal heart sounds.   No murmur heard. Pulmonary/Chest: Effort normal and breath sounds normal. No  stridor. No respiratory distress. He has no decreased breath sounds. He has no wheezes. He has no rhonchi. He has no rales. He exhibits no tenderness.  Abdominal: Soft. There is no tenderness.  Musculoskeletal: He exhibits no edema, tenderness or deformity.       Right shoulder: Normal.       Left shoulder: Normal.       Right elbow: Normal.      Left elbow: Normal.       Right hip: Normal.       Left hip: Normal.       Right knee: Normal.       Left knee: Normal.       Right ankle: Normal.       Left ankle: Normal.       Cervical back: Normal.       Thoracic back: Normal.       Right hand: Normal.       Left hand: Normal.  Lymphadenopathy:       Head (right side): No submental, no submandibular, no tonsillar, no preauricular, no posterior auricular and no occipital adenopathy present.       Head (left side): No submental, no submandibular, no tonsillar, no preauricular, no posterior auricular and no occipital adenopathy present.    He has no cervical adenopathy.       Right cervical: No superficial cervical, no deep cervical and no posterior cervical adenopathy present.      Left cervical: No superficial cervical, no deep  cervical and no posterior cervical adenopathy present.  Neurological: He is alert and oriented to person, place, and time. He has normal strength. He is not disoriented. He displays no atrophy, no tremor and normal reflexes. No cranial nerve deficit or sensory deficit. He exhibits normal muscle tone. He displays no seizure activity. Coordination and gait normal. GCS eye subscore is 4. GCS verbal subscore is 5. GCS motor subscore is 6.  Skin: Skin is warm and dry. Capillary refill takes less than 2 seconds. No abrasion, no bruising, no burn, no ecchymosis, no laceration, no lesion, no petechiae and no rash noted. He is not diaphoretic. No cyanosis or erythema. No pallor. Nails show no clubbing.  Psychiatric: He has a normal mood and affect. His speech is normal and behavior  is normal. Judgment and thought content normal. Cognition and memory are normal.  Nursing note and vitals reviewed.   Urgent Care Course   Clinical Course    Procedures (including critical care time)  Labs Review Labs Reviewed - No data to display  Imaging Review No results found.     MDM   1. Otitis externa, left   2. Recurrent acute serous otitis media of left ear   3. Otitis media with effusion, right    Patient may use normal saline nasal spray as needed.  Consider antihistamine or nasal steroid use e.g. flonase 1 spray each nostril BID.  Avoid triggers if possible.  Shower prior to bedtime if exposed to triggers.  If allergic dust/dust mites recommend mattress/pillow covers/encasements; washing linens, vacuuming, sweeping, dusting weekly.  Call or return to clinic as needed if these symptoms worsen or fail to improve as anticipated.   Patient verbalized understanding of instructions, agreed with plan of care and had no further questions at this time.  P2:  Avoidance and hand washing.  Supportive treatment.   No evidence of invasive bacterial infection, non toxic and well hydrated.  This is most likely self limiting viral infection.  I do not see where any further testing or imaging is necessary at this time.   I will suggest supportive care, rest, good hygiene and encourage the patient to take adequate fluids.  The patient is to return to clinic or EMERGENCY ROOM if symptoms worsen or change significantly e.g. ear pain, fever, purulent discharge from ears or bleeding.  Exitcare handout on otitis media with effusion given to patient.  Patient verbalized agreement and understanding of treatment plan.    Treatment as ordered. augmentin 875mg  po BID x 10 days.  Tylenol 1000mg  po QID or advil 800mg  po TID prn pain.  Patient has both at home.  Symptomatic therapy suggested fluids, NSAIDs and rest.  May take Tylenol or Motrin for fevers.  Call or return to clinic as needed if these  symptoms worsen or fail to improve as anticipated. Exitcare handout on otitis media given to patient.  Patient verbalized agreement and understanding of treatment plan.   P2:  Hand washing  Treatment as ordered. Ciprofloxacin otic 0.43ml in left ear BID x 7 days. Avoid water in ears do not flush ears or put other fluids in ears e.g. Rubbing alcohol/oils/debrox/etc.  Symptomatic therapy suggested fluids, NSAIDs and rest.  May take Tylenol or Motrin for fevers.  Call or return to clinic as needed if these symptoms worsen or fail to improve as anticipated.    I do not see where any further testing or imaging is necessary at this time.   I will suggest supportive care, rest,  good hygiene and encourage the patient to take adequate fluids.  The patient is to return to clinic or EMERGENCY ROOM if symptoms worsen or change significantly e.g. ear pain, fever, purulent discharge from ears or bleeding.  Exitcare handout on otitis externa given to patient.  Patient verbalized agreement and understanding of treatment plan and had no further questions at this time.     Olen Cordial, NP 08/14/15 2016

## 2015-08-14 NOTE — Discharge Instructions (Signed)
Advil 800mg  po TID prn pain OTC (4 tabs per dose maximum); avoid swimming/dunking head until infection resolved

## 2015-08-14 NOTE — ED Triage Notes (Signed)
Patient started having symptoms of left ear pain 3 weeks ago. Patient does have a history of recurring ear infections.

## 2015-08-25 ENCOUNTER — Encounter: Payer: Self-pay | Admitting: Gynecology

## 2015-08-25 ENCOUNTER — Ambulatory Visit
Admission: EM | Admit: 2015-08-25 | Discharge: 2015-08-25 | Disposition: A | Discharge status: Home / Self Care | Payer: BLUE CROSS/BLUE SHIELD | Attending: Family Medicine | Admitting: Family Medicine

## 2015-08-25 DIAGNOSIS — L259 Unspecified contact dermatitis, unspecified cause: Secondary | ICD-10-CM | POA: Diagnosis not present

## 2015-08-25 DIAGNOSIS — H6122 Impacted cerumen, left ear: Secondary | ICD-10-CM

## 2015-08-25 MED ORDER — TRIAMCINOLONE ACETONIDE 0.1 % EX CREA: 1.0000 "application " | TOPICAL_CREAM | Freq: Two times a day (BID) | CUTANEOUS | 0 refills | Status: AC

## 2015-08-25 NOTE — ED Triage Notes (Signed)
Patient c/o rash on left arm x 3 days. Patient also would like to recheck left ear infection which he was seen for x 2 weeks ago.

## 2015-08-25 NOTE — ED Provider Notes (Signed)
[facility] ____________________________________________  Time seen: Approximately 1600 PM  I have reviewed the triage vital signs and the nursing notes.   HISTORY  Chief Complaint Rash  HPI Micheal Yoder is a 50 y.o. male   Presents for evaluation of rash to left forearm. States rash has been present x 3 days. States rash is very itchy. Reports day before rash onset he was doing yardwork and moving brush that his wife had put together. States he may have come in contact with plant that made him itch. Reports he has came in contact with plants in past with similar rash. Denies any pain or burning with rash. States rash is minimally worse over last two days, denies other spreading. Denies pain. Denies other triggers. Denies changes in foods, medications, detergents, soaps.  Patient also reports that he was recently seen in urgent care approximately 2 weeks ago for left ear infection and states that he would like to have the area reexamined to ensure improvement. Patient states his ear is no longer painful but reports his hearing has not fully returned. Patient reports his hearing is better but states it is not 100%. Denies any pain, drainage, swelling or other complaints from ear. Denies any cough, congestion, fevers or other complaints. Patient reports feels well otherwise.  BABAOFF, Caryl Bis, MD: PCP   Past Medical History:  Diagnosis Date  . Asthma   . Dupuytren's disease     Patient Active Problem List   Diagnosis Date Noted  . Allergic rhinitis 04/20/2015  . Airway hyperreactivity 04/20/2015  . Esophageal reflux 04/20/2015  . Plantar fascial fibromatosis 04/20/2015  . Contracture of palmar fascia (Dupuytren's) 01/02/2015  . Family history of malignant neoplasm of prostate 08/04/2005  . Hypercholesterolemia without hypertriglyceridemia 01/12/2005    Past Surgical History:  Procedure Laterality Date  . RHINOPLASTY    . TONSILLECTOMY      No current  facility-administered medications for this encounter.   Current Outpatient Prescriptions:  Marland Kitchen  Multiple Vitamin (MULTIVITAMIN) capsule, Take 1 capsule by mouth daily., Disp: , Rfl:  .  Triamcinolone Acetonide (NASACORT ALLERGY 24HR NA), Place into the nose., Disp: , Rfl:  .  amoxicillin-clavulanate (AUGMENTIN) 875-125 MG tablet, Take 1 tablet by mouth every 12 (twelve) hours., Disp: 20 tablet, Rfl: 0 .  triamcinolone cream (KENALOG) 0.1 %, Apply 1 application topically 2 (two) times daily. For 7 days, Disp: 30 g, Rfl: 0  Allergies Review of patient's allergies indicates no known allergies.  Family History  Problem Relation Age of Onset  . Emphysema Mother     Social History Social History  Substance Use Topics  . Smoking status: Never Smoker  . Smokeless tobacco: Never Used  . Alcohol use No    Review of Systems Constitutional: No fever/chills Eyes: No visual changes. ENT: No sore throat.As above Cardiovascular: Denies chest pain. Respiratory: Denies shortness of breath. Gastrointestinal: No abdominal pain.  No nausea, no vomiting.  No diarrhea.  No constipation. Genitourinary: Negative for dysuria. Musculoskeletal: Negative for back pain. Skin: Positive for rash. Neurological: Negative for headaches, focal weakness or numbness.  10-point ROS otherwise negative.  ____________________________________________   PHYSICAL EXAM:  VITAL SIGNS: ED Triage Vitals [08/25/15 1559]  Enc Vitals Group     BP 110/71     Pulse Rate 93     Resp 16     Temp 98.5 F (36.9 C)     Temp Source Oral     SpO2 100 %     Weight  Height      Head Circumference      Peak Flow      Pain Score      Pain Loc      Pain Edu?      Excl. in [place]?     Constitutional: Alert and oriented. Well appearing and in no acute distress. Eyes: Conjunctivae are normal. PERRL. EOMI. ENT      Head: Normocephalic and atraumatic.     Ears: Right: No erythema, normal TM, nontender. Left: Complete  cerumen impaction, unable to visualize TM. Post cerumen removal with curette and irrigation, mild dullness at the TM, no erythema, no fluid visualized, no exudate, no swelling and nontender. No surrounding erythema or swelling bilaterally.      Nose: No congestion/rhinnorhea.      Mouth/Throat: Mucous membranes are moist.Oropharynx non-erythematous. Hematological/Lymphatic/Immunilogical: No cervical lymphadenopathy. Cardiovascular: Normal rate, regular rhythm. Grossly normal heart sounds.  Good peripheral circulation. Respiratory: Normal respiratory effort without tachypnea nor retractions. Breath sounds are clear and equal bilaterally. No wheezes/rales/rhonchi.. Musculoskeletal:  Ambulatory with a steady gait. Neurologic:  Normal speech and language. No gross focal neurologic deficits are appreciated. Speech is normal. No gait instability.  Skin:  Skin is warm, dry and intact. No rash noted. Except: Lower aspect distal forearm minimally erythematous scattered vesicular and papular rash, no induration, no flexors, no drainage, nontender, rash is pruritic, No surrounding erythema. Psychiatric: Mood and affect are normal. Speech and behavior are normal. Patient exhibits appropriate insight and judgment   ___________________________________________   LABS (all labs ordered are listed, but only abnormal results are displayed)  Labs Reviewed - No data to display ____________________________________________  PROCEDURES Procedures  Ceruminosis is noted.  Wax is removed by curet and irrigation. Patient tolerated well..  ____________________________________________   INITIAL IMPRESSION / ASSESSMENT AND PLAN / ED COURSE  Pertinent labs & imaging results that were available during my care of the patient were reviewed by me and considered in my medical decision making (see chart for details).  Well-appearing patient. No acute distress. Patient with rash to lower aspect of left wrist, suspect  contact dermatitis. Rash is localized. Patient reports has taken Benadryl which has helped with itching but states rash still present. Denies using any other medications. Will treat with topical triamcinolone. Encourage not to scratch.And monitor for triggers.Discussed indication, risks and benefits of medications with patient.  Patient left ear complete cerumen impaction. Cerumen removed with curet and irrigation. Post cerumen removal, ear reexamined. Canal clear and able to visualize TM. Minimal dullness at TM, no erythema or fluid visualize. TM appears intact. Patient reports post room in removal, hearing improved. Encouraged ENT follow-up as needed for continued complaints.  Discussed follow up with Primary care physician this week. Discussed follow up and return parameters including no resolution or any worsening concerns. Patient verbalized understanding and agreed to plan.   ____________________________________________   FINAL CLINICAL IMPRESSION(S) / ED DIAGNOSES  Final diagnoses:  Contact dermatitis  Cerumen impaction, left     Discharge Medication List as of 08/25/2015  4:44 PM    START taking these medications   Details  triamcinolone cream (KENALOG) 0.1 % Apply 1 application topically 2 (two) times daily. For 7 days, Starting Sun 08/25/2015, Until Sun 09/01/2015, Normal        Note: This dictation was prepared with Dragon dictation along with smaller phrase technology. Any transcriptional errors that result from this process are unintentional.    Clinical Course      Marylene Land, NP  08/25/15 1837  

## 2015-08-25 NOTE — Discharge Instructions (Signed)
Use medication as prescribed.Monitor closely. Avoid scratching.   Follow up with your primary care physician this week as needed. Return to Urgent care for new or worsening concerns.

## 2015-09-22 ENCOUNTER — Ambulatory Visit
Admission: EM | Admit: 2015-09-22 | Discharge: 2015-09-22 | Disposition: A | Discharge status: Home / Self Care | Payer: BLUE CROSS/BLUE SHIELD | Attending: Family Medicine | Admitting: Family Medicine

## 2015-09-22 ENCOUNTER — Encounter: Payer: Self-pay | Admitting: Gynecology

## 2015-09-22 DIAGNOSIS — M436 Torticollis: Secondary | ICD-10-CM | POA: Diagnosis not present

## 2015-09-22 MED ORDER — NAPROXEN 500 MG PO TABS: 500.0000 mg | ORAL_TABLET | Freq: Two times a day (BID) | ORAL | 0 refills | Status: DC

## 2015-09-22 MED ORDER — DIAZEPAM 2 MG PO TABS: 2.0000 mg | ORAL_TABLET | Freq: Three times a day (TID) | ORAL | 0 refills | Status: DC

## 2015-09-22 MED ORDER — TIZANIDINE HCL 6 MG PO CAPS: 6.0000 mg | ORAL_CAPSULE | Freq: Three times a day (TID) | ORAL | 0 refills | Status: DC

## 2015-09-22 MED ORDER — KETOROLAC TROMETHAMINE 60 MG/2ML IM SOLN
60.0000 mg | Freq: Once | INTRAMUSCULAR | Status: AC
  Administered 2015-09-22: 60 mg via INTRAMUSCULAR

## 2015-09-22 NOTE — ED Provider Notes (Signed)
CSN: [id number]     Arrival date & time 09/22/15  [id number] History   None    Chief Complaint  Patient presents with  . Neck Pain   (Consider location/radiation/quality/duration/timing/severity/associated sxs/prior Treatment) HPI  This a 50 year old. Male who presents with a contact that he noticed 2 days ago but was so worse last night. He sates that he has no radicular symptoms. Most of his pain is in the right side at the mid neck.Marland Kitchen He found it very uncomfortable last night was sleeping and eventually ended up sleeping on a very thin pillow which more comfortable. Is remember any specific injury that may have caused his neck hurt. He does not have any recollection of injury at work or any change in activities that may have precipitated it. He does know from previous neck pain and previous x-rays that he does have degenerative disc disease.     Past Medical History:  Diagnosis Date  . Asthma   . Dupuytren's disease    Past Surgical History:  Procedure Laterality Date  . RHINOPLASTY    . TONSILLECTOMY     Family History  Problem Relation Age of Onset  . Emphysema Mother    Social History  Substance Use Topics  . Smoking status: Never Smoker  . Smokeless tobacco: Never Used  . Alcohol use No    Review of Systems  Constitutional: Positive for activity change. Negative for appetite change, chills, fatigue and fever.  Musculoskeletal: Positive for neck pain and neck stiffness.  All other systems reviewed and are negative.   Allergies  Review of patient's allergies indicates no known allergies.  Home Medications   Prior to Admission medications   Medication Sig Start Date End Date Taking? Authorizing Provider  Multiple Vitamin (MULTIVITAMIN) capsule Take 1 capsule by mouth daily.   Yes Historical Provider, MD  Triamcinolone Acetonide (NASACORT ALLERGY 24HR NA) Place into the nose.   Yes Historical Provider, MD  amoxicillin-clavulanate (AUGMENTIN) 875-125 MG tablet Take 1  tablet by mouth every 12 (twelve) hours. 08/14/15   Olen Cordial, NP  diazepam (VALIUM) 2 MG tablet Take 1 tablet (2 mg total) by mouth 3 (three) times daily. 09/22/15   Lorin Picket, PA-C  naproxen (NAPROSYN) 500 MG tablet Take 1 tablet (500 mg total) by mouth 2 (two) times daily with a meal. 09/22/15   Lorin Picket, PA-C  tizanidine (ZANAFLEX) 6 MG capsule Take 1 capsule (6 mg total) by mouth 3 (three) times daily. Begin taking after finishing Valum for muscle spasm. 09/22/15   Lorin Picket, PA-C   Meds Ordered and Administered this Visit   Medications  ketorolac (TORADOL) injection 60 mg (60 mg Intramuscular Given 09/22/15 0856)    BP 107/66 (BP Location: Left Arm)   Pulse 68   Temp 97 F (36.1 C) (Tympanic)   Resp 16   Ht 5\' 6"  (1.676 m)   Wt 180 lb (81.6 kg)   SpO2 97%   BMI 29.05 kg/m  No data found.   Physical Exam  Constitutional: He is oriented to person, place, and time. He appears well-developed and well-nourished. No distress.  HENT:  Head: Normocephalic and atraumatic.  Eyes: EOM are normal. Pupils are equal, round, and reactive to light.  Neck:  Refer to musculoskeletal  Musculoskeletal:  Examination of the cervical spine shows the patient to have a list to the left of the head. He has had decreased range of motion to all quadrants however it is more noticeable with  right rotation right extension and right lateral flexion and extension. There is tenderness along the paracervical spinal muscles more prominent on the right side at approximately C4-C6 levels. Trapezii are tender and soft bilaterally. He has no interscapular tenderness or spasm seen. Upper extremity strength and sensation is intact throughout. Upper extremity DTRs are 2+ over 4 and bilaterally symmetrical.  Neurological: He is alert and oriented to person, place, and time. He has normal reflexes. He displays normal reflexes. He exhibits normal muscle tone. Coordination normal.  Skin: Skin is  warm and dry. He is not diaphoretic.  Psychiatric: He has a normal mood and affect. His behavior is normal. Judgment and thought content normal.  Nursing note and vitals reviewed.   Urgent Care Course   Clinical Course    Procedures (including critical care time)  Labs Review Labs Reviewed - No data to display  Imaging Review No results found.  Medications  ketorolac (TORADOL) injection 60 mg (60 mg Intramuscular Given 09/22/15 0856)    Visual Acuity Review  Right Eye Distance:   Left Eye Distance:   Bilateral Distance:    Right Eye Near:   Left Eye Near:    Bilateral Near:         MDM   1. Torticollis, acute    Medications  ketorolac (TORADOL) injection 60 mg (60 mg Intramuscular Given 09/22/15 0856)  Plan: 1. Test/x-ray results and diagnosis reviewed with patient 2. rx as per orders; risks, benefits, potential side effects reviewed with patient 3. Recommend supportive treatment with Heat and ice for comfort. He will begin taking Valium for 2 days and then to switch to the Zanaflex. I have warned him regarding the use of the muscle relaxers of performing activities requiring cod judgment and concentration and to not drive while taking the muscle relaxers. He benefited from physical therapy or chiropractic care when asked him to follow-up with his primary care physician and is not improving in several days. 4. F/u prn if symptoms worsen or don't improve     Lorin Picket, PA-C 09/22/15 [id number]    Lorin Picket, PA-C 09/22/15 [phone]

## 2015-09-22 NOTE — ED Triage Notes (Signed)
Per patient c/o right side pain x 2 days ago. Per patient not sure if he slept badly on his neck.

## 2015-10-20 ENCOUNTER — Ambulatory Visit
Admission: EM | Admit: 2015-10-20 | Discharge: 2015-10-20 | Disposition: A | Discharge status: Home / Self Care | Payer: BLUE CROSS/BLUE SHIELD

## 2015-10-20 ENCOUNTER — Encounter: Payer: Self-pay | Admitting: Emergency Medicine

## 2015-10-20 DIAGNOSIS — H9212 Otorrhea, left ear: Secondary | ICD-10-CM | POA: Diagnosis not present

## 2015-10-20 NOTE — ED Triage Notes (Signed)
Patient c/o ear pain and drainage from left ear for the past 4 days.

## 2015-10-20 NOTE — ED Provider Notes (Signed)
[facility]    CSN: [id number] Arrival date & time: 10/20/15  0818  History   Chief Complaint Chief Complaint  Patient presents with  . Otalgia    left   HPI 50 year old male presents with concerns for ear infection.  Patient states that he has had some waxy drainage from his left ear for the past 4-5 days. No associated pain. No fever or chills. No other current upper respiratory symptoms. He states that he has had an upper respiratory infection recently. He also states that he's had an ear infection 3 months ago that was quite severe. He presents because he is concerned that this could be the early presentation of an ear infection. No other complaints at this time.  Past Medical History:  Diagnosis Date  . Asthma   . Dupuytren's disease    Patient Active Problem List   Diagnosis Date Noted  . Allergic rhinitis 04/20/2015  . Airway hyperreactivity 04/20/2015  . Esophageal reflux 04/20/2015  . Plantar fascial fibromatosis 04/20/2015  . Contracture of palmar fascia (Dupuytren's) 01/02/2015  . Family history of malignant neoplasm of prostate 08/04/2005  . Hypercholesterolemia without hypertriglyceridemia 01/12/2005   Past Surgical History:  Procedure Laterality Date  . RHINOPLASTY    . TONSILLECTOMY      Home Medications    Prior to Admission medications   Medication Sig Start Date End Date Taking? Authorizing Provider  diazepam (VALIUM) 2 MG tablet Take 1 tablet (2 mg total) by mouth 3 (three) times daily. 09/22/15   Lorin Picket, PA-C  Multiple Vitamin (MULTIVITAMIN) capsule Take 1 capsule by mouth daily.    Historical Provider, MD  naproxen (NAPROSYN) 500 MG tablet Take 1 tablet (500 mg total) by mouth 2 (two) times daily with a meal. 09/22/15   Lorin Picket, PA-C  tizanidine (ZANAFLEX) 6 MG capsule Take 1 capsule (6 mg total) by mouth 3 (three) times daily. Begin taking after finishing Valum for muscle spasm. 09/22/15   Lorin Picket, PA-C    Triamcinolone Acetonide (NASACORT ALLERGY 24HR NA) Place into the nose.    Historical Provider, MD    Family History Family History  Problem Relation Age of Onset  . Emphysema Mother   . Hypertension Father     Social History Social History  Substance Use Topics  . Smoking status: Never Smoker  . Smokeless tobacco: Never Used  . Alcohol use No   Allergies   Review of patient's allergies indicates no known allergies.  Review of Systems Review of Systems  Constitutional: Negative.   HENT: Positive for ear discharge. Negative for ear pain.    Physical Exam Triage Vital Signs ED Triage Vitals  Enc Vitals Group     BP 10/20/15 0843 113/67     Pulse Rate 10/20/15 0843 70     Resp 10/20/15 0843 16     Temp 10/20/15 0843 97.8 F (36.6 C)     Temp Source 10/20/15 0843 Oral     SpO2 10/20/15 0843 99 %     Weight 10/20/15 0842 185 lb (83.9 kg)     Height 10/20/15 0842 5\' 6"  (1.676 m)     Head Circumference --      Peak Flow --      Pain Score 10/20/15 0843 5     Pain Loc --      Pain Edu? --      Excl. in [place]? --    Updated Vital Signs BP 113/67 (BP Location: Left Arm)  Pulse 70   Temp 97.8 F (36.6 C) (Oral)   Resp 16   Ht 5\' 6"  (1.676 m)   Wt 185 lb (83.9 kg)   SpO2 99%   BMI 29.86 kg/m    Physical Exam  Constitutional: He is oriented to person, place, and time. He appears well-developed. No distress.  HENT:  Head: Normocephalic and atraumatic.  L ear - wax noted. No erythema of the canal. Normal TM.  Eyes: Conjunctivae are normal.  Cardiovascular: Normal rate and regular rhythm.   Pulmonary/Chest: Effort normal and breath sounds normal. No respiratory distress.  Neurological: He is alert and oriented to person, place, and time.  Psychiatric: He has a normal mood and affect.  Vitals reviewed.  UC Treatments / Results  Labs (all labs ordered are listed, but only abnormal results are displayed) Labs Reviewed - No data to display  EKG  EKG  Interpretation None      Radiology No results found.  Procedures Procedures (including critical care time)  Medications Ordered in UC Medications - No data to display   Initial Impression / Assessment and Plan / UC Course  I have reviewed the triage vital signs and the nursing notes.  Pertinent labs & imaging results that were available during my care of the patient were reviewed by me and considered in my medical decision making (see chart for details).  Clinical Course  50 year old male presents with concerns for left ear infection. Exam unremarkable. No evidence of infection. Advise use of OTC Debrox and follow up w/ PCP if develops pain/fever/other symptoms.  Final Clinical Impressions(s) / UC Diagnoses   Final diagnoses:  Otorrhea of left ear   New Prescriptions Discharge Medication List as of 10/20/2015  9:05 AM       Coral Spikes, DO 10/20/15 [id number]

## 2015-10-20 NOTE — Discharge Instructions (Signed)
No evidence of infection at this time.  Use debrox (over the counter) for the wax.  Follow up with your PCP if it worsens.  Take care  Dr. Lacinda Axon

## 2015-10-25 ENCOUNTER — Ambulatory Visit
Admission: EM | Admit: 2015-10-25 | Discharge: 2015-10-25 | Disposition: A | Discharge status: Home / Self Care | Payer: BLUE CROSS/BLUE SHIELD | Attending: Family Medicine | Admitting: Family Medicine

## 2015-10-25 DIAGNOSIS — H6692 Otitis media, unspecified, left ear: Secondary | ICD-10-CM

## 2015-10-25 DIAGNOSIS — H60502 Unspecified acute noninfective otitis externa, left ear: Secondary | ICD-10-CM

## 2015-10-25 MED ORDER — CIPROFLOXACIN-DEXAMETHASONE 0.3-0.1 % OT SUSP: 4.0000 [drp] | Freq: Two times a day (BID) | OTIC | 0 refills | Status: AC

## 2015-10-25 MED ORDER — CEFDINIR 300 MG PO CAPS: 300.0000 mg | ORAL_CAPSULE | Freq: Two times a day (BID) | ORAL | 0 refills | Status: AC

## 2015-10-25 NOTE — ED Triage Notes (Signed)
Pt seen here for similar on 10/8. Now having drainage coming from ears onto pillow. "It's turned into a full blown ear infection." Pain 3/10

## 2015-10-25 NOTE — ED Provider Notes (Signed)
[facility] ____________________________________________  Time seen: Approximately 8:08 PM  I have reviewed the triage vital signs and the nursing notes.   HISTORY  Chief Complaint Otalgia   HPI Micheal Yoder is a 50 y.o. male presents with a complaint of left ear pain and drainage. Patient reports he has been having some mild discomfort to his left ear for the last week. Reports today he has had actual pain to left ear that is described as a mild throbbing sensation with yellowish drainage. Patient expressed concern of ear infection as he has had similar in the past. Patient states minimal right ear discomfort. Patient reports that he thought he may have had ear wax build up and had used over-the-counter Debrox drops a few times but has not used today. Denies any pain radiation, hearing deficits or tinnitus. Denies any other recent medication changes.  Patient reports that he has had a recent upper respiratory infection that has since resolved. Reports last antibiotic use was approximately in August which was amoxicillin. Denies any other pain or complaints. Denies fevers, current nasal congestion or cough, sore throat, neck pain, back pain, dizziness or hearing changes.  BABAOFF, Caryl Bis, MD PCP   Past Medical History:  Diagnosis Date  . Asthma   . Dupuytren's disease     Patient Active Problem List   Diagnosis Date Noted  . Allergic rhinitis 04/20/2015  . Airway hyperreactivity 04/20/2015  . Esophageal reflux 04/20/2015  . Plantar fascial fibromatosis 04/20/2015  . Contracture of palmar fascia (Dupuytren's) 01/02/2015  . Family history of malignant neoplasm of prostate 08/04/2005  . Hypercholesterolemia without hypertriglyceridemia 01/12/2005    Past Surgical History:  Procedure Laterality Date  . RHINOPLASTY    . TONSILLECTOMY      Current Outpatient Rx  . Order #: [id number] Class: Normal  . Order #: [id number] Class: Normal  . Order #:  [id number] Class: Print  . Order #: [id number] Class: Historical Med  . Order #: [id number] Class: Normal  . Order #: [id number] Class: Normal  . Order #: [id number] Class: Historical Med    No current facility-administered medications for this encounter.   Current Outpatient Prescriptions:  .  cefdinir (OMNICEF) 300 MG capsule, Take 1 capsule (300 mg total) by mouth 2 (two) times daily., Disp: 20 capsule, Rfl: 0 .  ciprofloxacin-dexamethasone (CIPRODEX) otic suspension, Place 4 drops into the left ear 2 (two) times daily., Disp: 7.5 mL, Rfl: 0 .  diazepam (VALIUM) 2 MG tablet, Take 1 tablet (2 mg total) by mouth 3 (three) times daily., Disp: 6 tablet, Rfl: 0 .  Multiple Vitamin (MULTIVITAMIN) capsule, Take 1 capsule by mouth daily., Disp: , Rfl:  .  naproxen (NAPROSYN) 500 MG tablet, Take 1 tablet (500 mg total) by mouth 2 (two) times daily with a meal., Disp: 60 tablet, Rfl: 0 .  tizanidine (ZANAFLEX) 6 MG capsule, Take 1 capsule (6 mg total) by mouth 3 (three) times daily. Begin taking after finishing Valum for muscle spasm., Disp: 21 capsule, Rfl: 0 .  Triamcinolone Acetonide (NASACORT ALLERGY 24HR NA), Place into the nose., Disp: , Rfl:   Allergies Review of patient's allergies indicates no known allergies.  Family History  Problem Relation Age of Onset  . Emphysema Mother   . Hypertension Father     Social History Social History  Substance Use Topics  . Smoking status: Never Smoker  . Smokeless tobacco: Never Used  . Alcohol use No    Review of Systems Constitutional: No fever/chills Eyes: No visual changes. ENT: No sore  throat.As above. Cardiovascular: Denies chest pain. Respiratory: Denies shortness of breath. Gastrointestinal: No abdominal pain.  No nausea, no vomiting.  No diarrhea.  No constipation. Genitourinary: Negative for dysuria. Musculoskeletal: Negative for back pain. Skin: Negative for rash. Neurological: Negative for headaches, focal weakness or  numbness.  10-point ROS otherwise negative.  ____________________________________________   PHYSICAL EXAM:  VITAL SIGNS: ED Triage Vitals  Enc Vitals Group     BP 10/25/15 1936 114/69     Pulse Rate 10/25/15 1936 78     Resp 10/25/15 1936 18     Temp 10/25/15 1936 98.8 F (37.1 C)     Temp Source 10/25/15 1936 Oral     SpO2 10/25/15 1936 99 %     Weight 10/25/15 1938 183 lb (83 kg)     Height 10/25/15 1938 5\' 6"  (1.676 m)     Head Circumference --      Peak Flow --      Pain Score 10/25/15 1938 3     Pain Loc --      Pain Edu? --      Excl. in [place]? --     Constitutional: Alert and oriented. Well appearing and in no acute distress. Eyes: Conjunctivae are normal. PERRL. EOMI.  ENT      Head: Normocephalic and atraumatic.     Ears: Left: Nontender, mild to moderate exudate is yellowish in canal, unable to fully visualize TM, however moderate erythema noted at TM, TM appears to be intact. Right: Nontender, no exudate or drainage, normal TM, no erythema. No surrounding erythema, swelling or tenderness bilaterally.      Nose: No congestion/rhinnorhea.      Mouth/Throat: Mucous membranes are moist.Oropharynx non-erythematous. Neck: No stridor. Supple without meningismus.  Hematological/Lymphatic/Immunilogical: No cervical lymphadenopathy. Cardiovascular: Normal rate, regular rhythm. Grossly normal heart sounds.  Good peripheral circulation. Respiratory: Normal respiratory effort without tachypnea nor retractions. Breath sounds are clear and equal bilaterally. No wheezes/rales/rhonchi.. Gastrointestinal: Soft and nontender. Musculoskeletal: Ambulatory with steady gait. No midline cervical, thoracic or lumbar tenderness to palpation Neurologic:  Normal speech and language. No gross focal neurologic deficits are appreciated. Speech is normal. No gait instability.  Skin:  Skin is warm, dry and intact. No rash noted. Psychiatric: Mood and affect are normal. Speech and behavior are  normal. Patient exhibits appropriate insight and judgment   ___________________________________________   LABS (all labs ordered are listed, but only abnormal results are displayed)  Labs Reviewed - No data to display ____________________________________________   PROCEDURES Procedures   INITIAL IMPRESSION / ASSESSMENT AND PLAN / ED COURSE  Pertinent labs & imaging results that were available during my care of the patient were reviewed by me and considered in my medical decision making (see chart for details).  Well-appearing patient. Presents for complaints of left ear discomfort and drainage. Patient noted to have left external otitis with suspected otitis media as well, unable to fully visualize TM, but TM appears to be intact. Counseled regarding supportive care and treatment. Will treat patient with Ciprodex otic drops as well as oral Ceftin ear, as patient recently had amoxicillin. Encouraged patient to follow up with primary care physician or ENT as needed.Discussed indication, risks and benefits of medications with patient.  Discussed follow up with Primary care physician this week. Discussed follow up and return parameters including no resolution or any worsening concerns. Patient verbalized understanding and agreed to plan.   ____________________________________________   FINAL CLINICAL IMPRESSION(S) / ED DIAGNOSES  Final diagnoses:  Acute otitis  externa of left ear, unspecified type  Left otitis media, unspecified otitis media type     Discharge Medication List as of 10/25/2015  8:10 PM    START taking these medications   Details  cefdinir (OMNICEF) 300 MG capsule Take 1 capsule (300 mg total) by mouth 2 (two) times daily., Starting Fri 10/25/2015, Until Mon 11/04/2015, Normal    ciprofloxacin-dexamethasone (CIPRODEX) otic suspension Place 4 drops into the left ear 2 (two) times daily., Starting Fri 10/25/2015, Until Fri 11/01/2015, Normal        Note: This  dictation was prepared with Dragon dictation along with smaller phrase technology. Any transcriptional errors that result from this process are unintentional.    Clinical Course      Marylene Land, NP 10/25/15 2101

## 2015-10-25 NOTE — Discharge Instructions (Signed)
Take medication as prescribed. Rest. Drink plenty of fluids.   Follow up with your primary care physician or Ear, Nose Throat this week as needed. Return to Urgent care for new or worsening concerns.

## 2015-11-04 ENCOUNTER — Encounter: Payer: Self-pay | Admitting: Physical Therapy

## 2015-11-04 ENCOUNTER — Ambulatory Visit: Payer: BLUE CROSS/BLUE SHIELD | Attending: Orthopedic Surgery | Admitting: Physical Therapy

## 2015-11-04 DIAGNOSIS — M79645 Pain in left finger(s): Secondary | ICD-10-CM

## 2015-11-04 DIAGNOSIS — M25642 Stiffness of left hand, not elsewhere classified: Secondary | ICD-10-CM | POA: Diagnosis present

## 2015-11-04 DIAGNOSIS — M79642 Pain in left hand: Secondary | ICD-10-CM

## 2015-11-04 NOTE — Therapy (Signed)
[facility] [address] [place], [place], [address] Phone: [phone]   Fax:  [phone]  Physical Therapy Evaluation  Patient Details  Name: Micheal Yoder MRN: [id number] Date of Birth: Oct 13, 1965 Referring Provider: Gladis Riffle  Encounter Date: 11/04/2015      PT End of Session - 11/04/15 1009    Visit Number 1   Number of Visits 4   Date for PT Re-Evaluation 12/02/15   PT Start Time 0729   PT Stop Time 0815   PT Time Calculation (min) 46 min   Activity Tolerance Patient tolerated treatment well   Behavior During Therapy [facility] for tasks assessed/performed      Past Medical History:  Diagnosis Date  . Asthma   . Dupuytren's disease     Past Surgical History:  Procedure Laterality Date  . RHINOPLASTY    . TONSILLECTOMY      There were no vitals filed for this visit.       Subjective Assessment - 11/04/15 0840    Subjective Pt is almost 4 weeks s/p dupuytren's contracture release surgery on L palm. Pt states that he does not experience much pain; moreso reports stiffness in 2nd finger/when making a fist. Feels numbness in 2nd PIP/DIP that has not resolved since surgery; states his surgeon is aware of the fact and believes it will likely subside with healing. 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 Pt reports weakness in bilateral grip strength with L most affected.   Pertinent History s/p dupuytren's contracture release 4 weeks ago on L hand. Pt has had release on R hand.   Limitations --  grip strength   Patient Stated Goals pt would like to regain comfortable use of his L hand/increase grip strength.   Currently in Pain? No/denies        Objective:  Therapeutic Exercise: tendon glides, AROM L wrist/2nd finger MCP/PIP/DIP.   Manual tx: scar massage to healed incision on palmar surface of L hand; pt with  increased sensitivity distally with overall good tolerance. Minimal increase in tissue mobility following manual tx.  Pt response for medical necessity: Pt presents for physical therapy evaluation approx 4 weeks s/p dupuytren's contracture release. He is limited in L MCP/PIP/DIP flexion ROM of the 2nd digit; with difficulty making a fist. Demonstrates considerable weakness of L grip strength and with fine motor tasks. Pt educated on range/strength specific exercises to address deficits; plans to follow up in two weeks if necessary to reassess/progress exercises if necessary.         PT Education - 11/04/15 1007    Education provided Yes   Education Details see pt instructions: HEP for putty exercises/ROM for DIP/PIP   Person(s) Educated Patient   Methods Explanation;Demonstration;Handout   Comprehension Verbalized understanding;Returned demonstration             PT Long Term Goals - 11/04/15 1155      PT LONG TERM GOAL #1   Title Pt will report minimal-no increase in pain/stiffness of left palm following a full day of work to promote return to PLOF   Baseline 10/23: pt notes by the end of the work day (computer specific tasks) he has increased pain/stiffness in left palm   Time 2   Period Weeks   Status New     PT LONG TERM GOAL #2   Title  Pt will increase L grip strength by 5lbs to promote return to PLOF   Baseline 10/23: Grip Strength: R 65.5 70.5 L 26.6 28.8   Time 2   Period Weeks   Status New     PT LONG TERM GOAL #3   Title Pt will be independent with HEP program including ROM/strengthening exercises to increase L MCP/PIP/DIP ROM and return to PLOF   Baseline 10/23/ pt performing ROM exercises infrequently at home   Time 2   Period Weeks   Status New     PT LONG TERM GOAL #4   Title Pt will achieve symmetrical flexion ROM of L 2nd finger MCP, PIP and DIP as compared to R so he can complete work tasks without compensation/stiffness of L hand.   Baseline 10/23: 2nd  MCP R/L 65 deg, 61 deg. PIP: L 60% limited. DIP: L 80% limited.   Time 2   Period Weeks   Status New            Plan - 11/04/15 1048    Clinical Impression Statement Pt is a pleasant 50 year old male s/p dupuytren's contracture release of L palm approx. 4 weeks ago. He presents for physical therapy evaluation limited primarily by stiffness and N/T of PIP/DIP of 2nd finger. Pt reports that by the end of the work day (with near constant computer work/typing) he feels an increase in stiffness in his left palm. Notes pain at end range "grip." Grip strength: R 65.5/70.5 L 26.6/28.8. UE strength: WFL bilat. Wrist ROM: WFL with reports of tightening in palmar surface just proximal to MTP with wrist flexion. Finger ROM: MCP flexion of 2nd digit: L 61 deg, R 65 deg. 2nd PIP 60% limited L vs R. 2nd DIP 80% limited L vs R. Pt with good healing of palmar surface; steri strip on dorsal surface of 2nd finger. Mild-moderate swelling from just proximal to MTP to distal PIP of 2nd finger. SPT issued hand/finger ROM exercises with emphasis on active range, scar massage to healed incision on palmar surface of L hand, grip strength/finger strength exercises with putty to progress pt toward functional use of L hand. Pt will follow up with PT in 2 weeks to reassess and progress if necessary.   Rehab Potential Good   PT Frequency Other (comment)  pt to f/u with PT in 2 weeks if needed   PT Treatment/Interventions ADLs/Self Care Home Management;Therapeutic exercise;Patient/family education;Manual techniques;Passive range of motion;Scar mobilization;Splinting;Taping   PT Next Visit Plan reassess grip strength. progressive strengthening hand/wrist. reassess finger ROM   PT Home Exercise Plan see pt instructions   Consulted and Agree with Plan of Care Patient      Patient will benefit from skilled therapeutic intervention in order to improve the following deficits and impairments:  Decreased range of motion, Decreased  scar mobility, Decreased strength, Impaired sensation, Impaired UE functional use, Pain  Visit Diagnosis: Stiffness of left hand, not elsewhere classified  Pain in left hand  Pain in left finger(s)     Problem List Patient Active Problem List   Diagnosis Date Noted  . Allergic rhinitis 04/20/2015  . Airway hyperreactivity 04/20/2015  . Esophageal reflux 04/20/2015  . Plantar fascial fibromatosis 04/20/2015  . Contracture of palmar fascia (Dupuytren's) 01/02/2015  . Family history of malignant neoplasm of prostate 08/04/2005  . Hypercholesterolemia without hypertriglyceridemia 01/12/2005   Pura Spice, PT, DPT # [place], SPT 11/05/2015, 11:43 AM  [facility] [address] [place], [place], [address] Phone: [phone]   Fax:  [phone]  Name: Demonta Bumgardner MRN: [id number] Date of Birth: March 27, 1965

## 2015-11-18 ENCOUNTER — Encounter: Payer: Self-pay | Admitting: Physical Therapy

## 2015-11-18 ENCOUNTER — Ambulatory Visit: Payer: BLUE CROSS/BLUE SHIELD | Attending: Orthopedic Surgery | Admitting: Physical Therapy

## 2015-11-18 DIAGNOSIS — M25642 Stiffness of left hand, not elsewhere classified: Secondary | ICD-10-CM | POA: Diagnosis not present

## 2015-11-18 DIAGNOSIS — M79642 Pain in left hand: Secondary | ICD-10-CM | POA: Insufficient documentation

## 2015-11-18 DIAGNOSIS — M79645 Pain in left finger(s): Secondary | ICD-10-CM | POA: Insufficient documentation

## 2015-11-18 NOTE — Therapy (Signed)
[facility] [address] [place], [place], [address] Phone: [phone]   Fax:  [phone]  Physical Therapy Treatment/Discharge  Patient Details  Name: Micheal Yoder MRN: [id number] Date of Birth: 31-Mar-1965 Referring Provider: Gladis Riffle  Encounter Date: 11/18/2015      PT End of Session - 11/18/15 0823    Visit Number 2   Number of Visits 4   Date for PT Re-Evaluation 12/02/15   PT Start Time 0729   PT Stop Time 0813   PT Time Calculation (min) 44 min   Activity Tolerance Patient tolerated treatment well   Behavior During Therapy [facility] for tasks assessed/performed      Past Medical History:  Diagnosis Date  . Asthma   . Dupuytren's disease     Past Surgical History:  Procedure Laterality Date  . RHINOPLASTY    . TONSILLECTOMY      There were no vitals filed for this visit.      Subjective Assessment - 11/18/15 0816    Subjective Pt states that he has been performing his home exercise program regularly. States that he starts out stiff in the morning but his fingers loosen up after exercising. States he opened the scar on his knuckle several days after PT eval but wound has fully healed. Pt feels he is most limited by swelling in his hand/fingers.   Pertinent History s/p dupuytren's contracture release 4 weeks ago on L hand. Pt has had release on R hand.   Patient Stated Goals pt would like to regain comfortable use of his L hand/increase grip strength.   Currently in Pain? No/denies     Objective:  Manual tx: scar massage and STM of index finger to decrease adhesions, swelling and promote increased mobility. PROM with jt blocking of 3-5 at MTP, PIP, DIP.   Therapeutic Exercise: AROM with jt blocking of 3-5 MTP, PIP, DIP. Towel pull with emphasis on digits 3-5 to promote increased mobility,   Pt response for medical necessity: Pt presents with improved healing of dorsal incision of 4th finger; very mild  adhesions present - responsive to scar massage. Improved mobility following PROM/AROM with repeated movements. Pt demonstrates good understanding of HEP program and will continue treatment independently and follow up with PT as needed.      PT Education - 11/18/15 6151    Education provided Yes   Education Details Emphasized ice for inflammation/swelling. Scar massage to prevent adhesions.    Person(s) Educated Patient   Methods Explanation;Demonstration   Comprehension Verbalized understanding;Returned demonstration             PT Long Term Goals - 11/18/15 0847      PT LONG TERM GOAL #1   Title Pt will report minimal-no increase in pain/stiffness of left palm following a full day of work to promote return to PLOF   Baseline 10/23: pt notes by the end of the work day (computer specific tasks) he has increased pain/stiffness in left palm. 11/6: pt continues to experience stiffness after work but feels that regular practice of HEP program/scar massage is helping with stiffness.   Time 2   Period Weeks   Status Partially Met     PT LONG TERM GOAL #2   Title Pt will increase L grip strength by 5lbs to promote return to PLOF   Baseline 10/23: Grip Strength: R 65.5 70.5 L 26.6 28.8. 11/6: L 36.8   Time 2   Period Weeks  Status New     PT LONG TERM GOAL #3   Title Pt will be independent with HEP program including ROM/strengthening exercises to increase L MCP/PIP/DIP ROM and return to PLOF   Baseline 10/23/ pt performing ROM exercises infrequently at home. 11/6: pt with regular performance of HEP program   Time 2   Period Weeks   Status Achieved     PT LONG TERM GOAL #4   Title Pt will achieve symmetrical flexion ROM of L 2nd finger MCP, PIP and DIP as compared to R so he can complete work tasks without compensation/stiffness of L hand.   Baseline 10/23: 2nd MCP R/L 65 deg, 61 deg. PIP: L 60% limited. DIP: L 80% limited.   Time 2   Period Weeks   Status Partially Met                Plan - 11/18/15 0802    Clinical Impression Statement Pt presents with more functional use of L hand this session. He arrives with initial stiffness of index finger but mild tremor with active flexion. Pt demonstrates improved quality of motion by session end after repeated PROM/AROM. Improved grip strength of L 36.8. Pt is comfortable with independent management of sx at this time, will follow up with PT in the future as needed.   Rehab Potential Good   PT Frequency Other (comment)   PT Treatment/Interventions ADLs/Self Care Home Management;Therapeutic exercise;Patient/family education;Manual techniques;Passive range of motion;Scar mobilization;Splinting;Taping   PT Next Visit Plan reassess grip strength. progressive strengthening hand/wrist. reassess finger ROM   PT Home Exercise Plan see pt instructions   Consulted and Agree with Plan of Care Patient      Patient will benefit from skilled therapeutic intervention in order to improve the following deficits and impairments:  Decreased range of motion, Decreased scar mobility, Decreased strength, Impaired sensation, Impaired UE functional use, Pain  Visit Diagnosis: Stiffness of left hand, not elsewhere classified  Pain in left hand  Pain in left finger(s)     Problem List Patient Active Problem List   Diagnosis Date Noted  . Allergic rhinitis 04/20/2015  . Airway hyperreactivity 04/20/2015  . Esophageal reflux 04/20/2015  . Plantar fascial fibromatosis 04/20/2015  . Contracture of palmar fascia (Dupuytren's) 01/02/2015  . Family history of malignant neoplasm of prostate 08/04/2005  . Hypercholesterolemia without hypertriglyceridemia 01/12/2005   Pura Spice, PT, DPT # [id number] Mickel Baas Mykael Batz SPT 11/18/2015, 9:49 AM  [facility] [address] [place], [place], [address] Phone: [phone]   Fax:  [phone]  Name: Micheal Yoder MRN: [id number] Date  of Birth: 23-Sep-1965

## 2015-12-21 ENCOUNTER — Ambulatory Visit
Admission: EM | Admit: 2015-12-21 | Discharge: 2015-12-21 | Disposition: A | Discharge status: Home / Self Care | Payer: BLUE CROSS/BLUE SHIELD | Attending: Family Medicine | Admitting: Family Medicine

## 2015-12-21 DIAGNOSIS — J019 Acute sinusitis, unspecified: Secondary | ICD-10-CM

## 2015-12-21 MED ORDER — AMOXICILLIN-POT CLAVULANATE 875-125 MG PO TABS: 1.0000 | ORAL_TABLET | Freq: Two times a day (BID) | ORAL | 0 refills | Status: AC

## 2015-12-21 MED ORDER — HYDROCOD POLST-CPM POLST ER 10-8 MG/5ML PO SUER: 5.0000 mL | Freq: Two times a day (BID) | ORAL | 0 refills | Status: AC | PRN

## 2015-12-21 NOTE — ED Provider Notes (Signed)
CSN: [id number]     Arrival date & time 12/21/15  1130 History   First MD Initiated Contact with Patient 12/21/15 1155     Chief Complaint  Patient presents with  . Sinusitis   (Consider location/radiation/quality/duration/timing/severity/associated sxs/prior Treatment) Patient is a well-appearing 50 y.o male, presents today for 2-week history of congestion, running nose, sinus pain/pressure, and a cough. He have tried Tylenol cold OTC with minimal relief. Patient believes that he is developing a sinus infection. Patient denies fever at home.       Past Medical History:  Diagnosis Date  . Asthma   . Dupuytren's disease    Past Surgical History:  Procedure Laterality Date  . RHINOPLASTY    . TONSILLECTOMY     Family History  Problem Relation Age of Onset  . Emphysema Mother   . Hypertension Father    Social History  Substance Use Topics  . Smoking status: Never Smoker  . Smokeless tobacco: Never Used  . Alcohol use No    Review of Systems  Constitutional: Negative for appetite change, chills, fatigue and fever.  HENT: Positive for congestion, rhinorrhea, sinus pain and sinus pressure. Negative for ear pain, sneezing and sore throat.   Respiratory: Positive for cough. Negative for shortness of breath.   Cardiovascular: Negative for chest pain and palpitations.  Gastrointestinal: Negative for abdominal pain, nausea and vomiting.  Musculoskeletal: Negative for myalgias.  Skin: Negative for rash.  Neurological: Positive for headaches. Negative for dizziness and weakness.    Allergies  Patient has no known allergies.  Home Medications   Prior to Admission medications   Medication Sig Start Date End Date Taking? Authorizing Provider  Multiple Vitamin (MULTIVITAMIN) capsule Take 1 capsule by mouth daily.   Yes Historical Provider, MD  amoxicillin-clavulanate (AUGMENTIN) 875-125 MG tablet Take 1 tablet by mouth 2 (two) times daily. 12/21/15 12/28/15  Barry Dienes, NP   chlorpheniramine-HYDROcodone (TUSSIONEX PENNKINETIC ER) 10-8 MG/5ML SUER Take 5 mLs by mouth every 12 (twelve) hours as needed for cough. 12/21/15 12/28/15  Barry Dienes, NP  diazepam (VALIUM) 2 MG tablet Take 1 tablet (2 mg total) by mouth 3 (three) times daily. 09/22/15   Lorin Picket, PA-C  naproxen (NAPROSYN) 500 MG tablet Take 1 tablet (500 mg total) by mouth 2 (two) times daily with a meal. 09/22/15   Lorin Picket, PA-C  tizanidine (ZANAFLEX) 6 MG capsule Take 1 capsule (6 mg total) by mouth 3 (three) times daily. Begin taking after finishing Valum for muscle spasm. 09/22/15   Lorin Picket, PA-C  Triamcinolone Acetonide (NASACORT ALLERGY 24HR NA) Place into the nose.    Historical Provider, MD   Meds Ordered and Administered this Visit  Medications - No data to display  BP 118/71 (BP Location: Left Arm)   Pulse 77   Temp 98.7 F (37.1 C) (Oral)   Resp 18   Ht 5\' 6"  (1.676 m)   Wt 183 lb (83 kg)   SpO2 96%   BMI 29.54 kg/m  No data found.   Physical Exam  Constitutional: He is oriented to person, place, and time. He appears well-developed and well-nourished.  HENT:  Head: Normocephalic and atraumatic.  Right Ear: External ear normal.  Left Ear: External ear normal.  Nose: Nose normal.  Mouth/Throat: Oropharynx is clear and moist. No oropharyngeal exudate.  Left TM slightly erythematous, frontal and maxillary sinuses non-tender to percuss  Eyes: EOM are normal. Pupils are equal, round, and reactive to light.  Neck: Normal  range of motion. Neck supple.  Cardiovascular: Normal rate, regular rhythm and normal heart sounds.   Pulmonary/Chest: Effort normal and breath sounds normal.  Abdominal: Soft. Bowel sounds are normal. He exhibits no distension. There is no tenderness.  Neurological: He is alert and oriented to person, place, and time.  Skin: Skin is dry.  Nursing note and vitals reviewed.   Urgent Care Course   Clinical Course     Procedures (including  critical care time)  Labs Review Labs Reviewed - No data to display  Imaging Review No results found.  MDM   1. Acute non-recurrent sinusitis, unspecified location    Symptoms have been present for 2 weeks with no improvement. Will start Augmentin BID x 7 days. May continue delsym OTC if delsym is helping or you may use the prescription cough syrup every 12 hours as needed for cough. Patient states understanding and will call with questions or problems. Patient instructed to call or follow up with his/her primary care doctor if failure to improve or change in symptoms. Discharge instruction given.     Barry Dienes, NP 12/21/15 1212

## 2015-12-21 NOTE — ED Triage Notes (Signed)
Patient states that he has been having sinus pain and pressure with a cough, headache. Patient states that this started 2 weeks ago.

## 2016-03-09 ENCOUNTER — Ambulatory Visit
Admission: EM | Admit: 2016-03-09 | Discharge: 2016-03-09 | Disposition: A | Discharge status: Home / Self Care | Payer: BLUE CROSS/BLUE SHIELD | Attending: Family Medicine | Admitting: Family Medicine

## 2016-03-09 DIAGNOSIS — J40 Bronchitis, not specified as acute or chronic: Secondary | ICD-10-CM | POA: Diagnosis not present

## 2016-03-09 MED ORDER — DOXYCYCLINE HYCLATE 100 MG PO CAPS: 100.0000 mg | ORAL_CAPSULE | Freq: Two times a day (BID) | ORAL | 0 refills | Status: DC

## 2016-03-09 MED ORDER — HYDROCOD POLST-CPM POLST ER 10-8 MG/5ML PO SUER: 5.0000 mL | Freq: Every evening | ORAL | 0 refills | Status: DC | PRN

## 2016-03-09 MED ORDER — BENZONATATE 100 MG PO CAPS: 100.0000 mg | ORAL_CAPSULE | Freq: Three times a day (TID) | ORAL | 0 refills | Status: DC | PRN

## 2016-03-09 MED ORDER — PREDNISONE 10 MG PO TABS: ORAL_TABLET | ORAL | 0 refills | Status: DC

## 2016-03-09 NOTE — ED Triage Notes (Signed)
Patient complains of cough and congestion x 1 month. Patient states that he did take some left over amoxicillin and did not help. Patient states that he has been unable to shake this cough.

## 2016-03-09 NOTE — Discharge Instructions (Signed)
Take medication as prescribed. Rest. Drink plenty of fluids.  ° °Follow up with your primary care physician this week as needed. Return to Urgent care for new or worsening concerns.  ° °

## 2016-03-09 NOTE — ED Provider Notes (Signed)
[facility] ____________________________________________  Time seen: Approximately 1920 PM  I have reviewed the triage vital signs and the nursing notes.   HISTORY  Chief Complaint Cough   HPI Micheal Yoder is a 51 y.o. male presenting for complaints of cough x 3-4 weeks. Patient reports that he initially felt like he had a viral infection, but states he did not feel like it was the fluids he never had a fever, and reports that he felt that he then progresses to a sinus infection. Patient reports he had left over Augmentin at home and he then took one weeks worth of Augmentin that resolved the sinus congestion, thick sinus drainage and sinus pressure sensations. Patient reports he finished the antibiotic for sinusitis approximately 1 week ago, but reports he has continued with cough. Patient reports cough is primarily a dry hacking cough, occasionally productive. Denies any fevers. States occasional wheeze with cough. Denies shortness of breath. Reports he does have a history of pneumonia bronchitis and sinusitis.  Reports his children have been intermittently sick as well. Patient states that he is continue with normal energy levels has remained active, but reports that the lingering cough is aggravating to him.  Denies chest pain, shortness of breath, abdominal pain, dysuria, extremity pain, extremity swelling or rash. Denies recent sickness. Denies recent antibiotic use.   BABAOFF, Caryl Bis, MD PCP   Past Medical History:  Diagnosis Date  . Asthma   . Dupuytren's disease     Patient Active Problem List   Diagnosis Date Noted  . Allergic rhinitis 04/20/2015  . Airway hyperreactivity 04/20/2015  . Esophageal reflux 04/20/2015  . Plantar fascial fibromatosis 04/20/2015  . Contracture of palmar fascia (Dupuytren's) 01/02/2015  . Family history of malignant neoplasm of prostate 08/04/2005  . Hypercholesterolemia without hypertriglyceridemia 01/12/2005    Past  Surgical History:  Procedure Laterality Date  . RHINOPLASTY    . TONSILLECTOMY       No current facility-administered medications for this encounter.   Current Outpatient Prescriptions:  Marland Kitchen  Multiple Vitamin (MULTIVITAMIN) capsule, Take 1 capsule by mouth daily., Disp: , Rfl:  .  benzonatate (TESSALON PERLES) 100 MG capsule, Take 1 capsule (100 mg total) by mouth 3 (three) times daily as needed for cough., Disp: 15 capsule, Rfl: 0 .  chlorpheniramine-HYDROcodone (TUSSIONEX PENNKINETIC ER) 10-8 MG/5ML SUER, Take 5 mLs by mouth at bedtime as needed for cough. do not drive or operate machinery while taking as can cause drowsiness., Disp: 75 mL, Rfl: 0 .  diazepam (VALIUM) 2 MG tablet, Take 1 tablet (2 mg total) by mouth 3 (three) times daily., Disp: 6 tablet, Rfl: 0 .  doxycycline (VIBRAMYCIN) 100 MG capsule, Take 1 capsule (100 mg total) by mouth 2 (two) times daily., Disp: 20 capsule, Rfl: 0 .  naproxen (NAPROSYN) 500 MG tablet, Take 1 tablet (500 mg total) by mouth 2 (two) times daily with a meal., Disp: 60 tablet, Rfl: 0 .  predniSONE (DELTASONE) 10 MG tablet, Start 60 mg po day one, then 50 mg po day two, taper by 10 mg daily until complete., Disp: 21 tablet, Rfl: 0 .  tizanidine (ZANAFLEX) 6 MG capsule, Take 1 capsule (6 mg total) by mouth 3 (three) times daily. Begin taking after finishing Valum for muscle spasm., Disp: 21 capsule, Rfl: 0 .  Triamcinolone Acetonide (NASACORT ALLERGY 24HR NA), Place into the nose., Disp: , Rfl:   Allergies Patient has no known allergies.  Family History  Problem Relation Age of Onset  .  Emphysema Mother   . Hypertension Father     Social History Social History  Substance Use Topics  . Smoking status: Never Smoker  . Smokeless tobacco: Never Used  . Alcohol use No    Review of Systems Constitutional: No fever/chills. As above. Eyes: No visual changes. ENT: No sore throat. Cardiovascular: Denies chest pain. Respiratory: Denies shortness of  breath. Gastrointestinal: No abdominal pain.  No nausea, no vomiting.  No diarrhea.  No constipation. Genitourinary: Negative for dysuria. Musculoskeletal: Negative for back pain. Skin: Negative for rash. Neurological: Negative for headaches, focal weakness or numbness.  10-point ROS otherwise negative.  ____________________________________________   PHYSICAL EXAM:  VITAL SIGNS: ED Triage Vitals  Enc Vitals Group     BP 03/09/16 1907 118/72     Pulse Rate 03/09/16 1907 89     Resp 03/09/16 1907 18     Temp 03/09/16 1907 98.1 F (36.7 C)     Temp Source 03/09/16 1907 Oral     SpO2 03/09/16 1907 97 %     Weight 03/09/16 1901 186 lb (84.4 kg)     Height 03/09/16 1901 5\' 6"  (1.676 m)     Head Circumference --      Peak Flow --      Pain Score 03/09/16 1940 3     Pain Loc --      Pain Edu? --      Excl. in [place]? --    Constitutional: Alert and oriented. Well appearing and in no acute distress. Eyes: Conjunctivae are normal. PERRL. EOMI. Head: Atraumatic. No sinus tenderness to palpation. No swelling. No erythema.  Ears: no erythema, normal TMs bilaterally.   Nose: Mild nasal congestion  Mouth/Throat: Mucous membranes are moist. No pharyngeal erythema. No tonsillar swelling or exudate.  Neck: No stridor.  No cervical spine tenderness to palpation. Hematological/Lymphatic/Immunilogical: No cervical lymphadenopathy. Cardiovascular: Normal rate, regular rhythm. Grossly normal heart sounds.  Good peripheral circulation. Respiratory: Normal respiratory effort.  No retractions. Mild scattered inspiratory wheezes. No rales or rhonchi. Dry intermittent cough noted in room with mild bronchospasm. Good air movement.  Gastrointestinal: Soft and nontender.  Musculoskeletal: Ambulatory with steady gait. No cervical, thoracic or lumbar tenderness to palpation. Neurologic:  Normal speech and language. No gait instability. Skin:  Skin appears warm, dry and intact. No rash noted. Psychiatric:  Mood and affect are normal. Speech and behavior are normal.  ___________________________________________   LABS (all labs ordered are listed, but only abnormal results are displayed)  Labs Reviewed - No data to display   PROCEDURES Procedures    INITIAL IMPRESSION / ASSESSMENT AND PLAN / ED COURSE  Pertinent labs & imaging results that were available during my care of the patient were reviewed by me and considered in my medical decision making (see chart for details).  Well-appearing patient. No acute distress. Discussed with patient suspect postviral bronchitis. As patient reports some past medical history including atypical pneumonia as well as bronchitis, discussed with patient evaluation of chest x-ray, patient declined. Will empirically treat with oral doxycycline, , when necessary test when necessary Tessalon Perles and with prednisone taper. Patient states he is unable to tolerate albuterol or any inhalers. Encouraged supportive care, rest, fluids and discussed close follow-up for no improvement with continuation of symptoms.Discussed indication, risks and benefits of medications with patient.   Discussed follow up with Primary care physician this week. Discussed follow up and return parameters including no resolution or any worsening concerns. Patient verbalized understanding and agreed to plan.  ____________________________________________   FINAL CLINICAL IMPRESSION(S) / ED DIAGNOSES  Final diagnoses:  Bronchitis     Discharge Medication List as of 03/09/2016  7:37 PM    START taking these medications   Details  benzonatate (TESSALON PERLES) 100 MG capsule Take 1 capsule (100 mg total) by mouth 3 (three) times daily as needed for cough., Starting Mon 03/09/2016, Normal    chlorpheniramine-HYDROcodone (TUSSIONEX PENNKINETIC ER) 10-8 MG/5ML SUER Take 5 mLs by mouth at bedtime as needed for cough. do not drive or operate machinery while taking as can cause drowsiness.,  Starting Mon 03/09/2016, Print    doxycycline (VIBRAMYCIN) 100 MG capsule Take 1 capsule (100 mg total) by mouth 2 (two) times daily., Starting Mon 03/09/2016, Normal    predniSONE (DELTASONE) 10 MG tablet Start 60 mg po day one, then 50 mg po day two, taper by 10 mg daily until complete., Normal        Note: This dictation was prepared with Dragon dictation along with smaller phrase technology. Any transcriptional errors that result from this process are unintentional.         Marylene Land, NP 03/11/16 [phone]

## 2016-04-09 ENCOUNTER — Other Ambulatory Visit: Payer: Self-pay

## 2016-04-09 ENCOUNTER — Telehealth: Payer: Self-pay

## 2016-04-09 DIAGNOSIS — Z1211 Encounter for screening for malignant neoplasm of colon: Secondary | ICD-10-CM

## 2016-04-09 NOTE — Telephone Encounter (Signed)
Gastroenterology Pre-Procedure Review  Request Date:  Requesting Physician: Dr.   PATIENT REVIEW QUESTIONS: The patient responded to the following health history questions as indicated:    1. Are you having any GI issues? no 2. Do you have a personal history of Polyps? no 3. Do you have a family history of Colon Cancer or Polyps? no 4. Diabetes Mellitus? no 5. Joint replacements in the past 12 months?no 6. Major health problems in the past 3 months?no 7. Any artificial heart valves, MVP, or defibrillator?no    MEDICATIONS & ALLERGIES:    Patient reports the following regarding taking any anticoagulation/antiplatelet therapy:   Plavix, Coumadin, Eliquis, Xarelto, Lovenox, Pradaxa, Brilinta, or Effient? no Aspirin? no  Patient confirms/reports the following medications:  Current Outpatient Prescriptions  Medication Sig Dispense Refill  . benzonatate (TESSALON PERLES) 100 MG capsule Take 1 capsule (100 mg total) by mouth 3 (three) times daily as needed for cough. 15 capsule 0  . chlorpheniramine-HYDROcodone (TUSSIONEX PENNKINETIC ER) 10-8 MG/5ML SUER Take 5 mLs by mouth at bedtime as needed for cough. do not drive or operate machinery while taking as can cause drowsiness. 75 mL 0  . diazepam (VALIUM) 2 MG tablet Take 1 tablet (2 mg total) by mouth 3 (three) times daily. 6 tablet 0  . doxycycline (VIBRAMYCIN) 100 MG capsule Take 1 capsule (100 mg total) by mouth 2 (two) times daily. 20 capsule 0  . Multiple Vitamin (MULTIVITAMIN) capsule Take 1 capsule by mouth daily.    . naproxen (NAPROSYN) 500 MG tablet Take 1 tablet (500 mg total) by mouth 2 (two) times daily with a meal. 60 tablet 0  . predniSONE (DELTASONE) 10 MG tablet Start 60 mg po day one, then 50 mg po day two, taper by 10 mg daily until complete. 21 tablet 0  . tizanidine (ZANAFLEX) 6 MG capsule Take 1 capsule (6 mg total) by mouth 3 (three) times daily. Begin taking after finishing Valum for muscle spasm. 21 capsule 0  .  Triamcinolone Acetonide (NASACORT ALLERGY 24HR NA) Place into the nose.     No current facility-administered medications for this visit.     Patient confirms/reports the following allergies:  No Known Allergies  No orders of the defined types were placed in this encounter.   AUTHORIZATION INFORMATION Primary Insurance: 1D#: Group #:  Secondary Insurance: 1D#: Group #:  SCHEDULE INFORMATION: Date: 04/24/16 Time: Location: [place]

## 2016-04-16 ENCOUNTER — Ambulatory Visit
Admission: EM | Admit: 2016-04-16 | Discharge: 2016-04-16 | Disposition: A | Discharge status: Home / Self Care | Payer: BLUE CROSS/BLUE SHIELD | Attending: Family Medicine | Admitting: Family Medicine

## 2016-04-16 DIAGNOSIS — J40 Bronchitis, not specified as acute or chronic: Secondary | ICD-10-CM

## 2016-04-16 DIAGNOSIS — J018 Other acute sinusitis: Secondary | ICD-10-CM

## 2016-04-16 DIAGNOSIS — J301 Allergic rhinitis due to pollen: Secondary | ICD-10-CM

## 2016-04-16 HISTORY — DX: Bronchitis, not specified as acute or chronic: J40

## 2016-04-16 MED ORDER — PREDNISONE 10 MG (21) PO TBPK: ORAL_TABLET | ORAL | 0 refills | Status: DC

## 2016-04-16 MED ORDER — AMOXICILLIN-POT CLAVULANATE 875-125 MG PO TABS: 1.0000 | ORAL_TABLET | Freq: Two times a day (BID) | ORAL | 0 refills | Status: DC

## 2016-04-16 NOTE — ED Triage Notes (Signed)
Pt c/o inner ear pain and sinus infection, he is getting over bronchitis and feels like he is getting a sinus infection.

## 2016-04-16 NOTE — ED Provider Notes (Signed)
[facility]    CSN: [id number] Arrival date & time: 04/16/16  1728     History   Chief Complaint Chief Complaint  Patient presents with  . Facial Pain    HPI Micheal Yoder is a 51 y.o. male.   Patient's here because he states his seasonal allergies and gotten worse. He states that his seasonal allergies been bothering him and vexing him now for about 3 weeks. He states that the nasal congestion and ear problems get worse over the last 3 days but everything started 3 weeks ago. He's had trouble with allergies and sinus congestion. He uses multiple allergy medications such as nasal sprays and antihistamines and decongestants. He states he saw his doctor about 2 weeks ago and his lungs were clear at that time. In February he was placed on prednisone because of bronchitis. He's used Augmentin before when his has sinus trouble.   The history is provided by the patient. No language interpreter was used.  Cough  Cough characteristics:  Non-productive Sputum characteristics:  Nondescript Timing:  Constant Progression:  Worsening Chronicity:  New Smoker: no   Context: exposure to allergens and fumes   Relieved by:  Decongestant Worsened by:  Nothing Associated symptoms: rhinorrhea and sinus congestion     Past Medical History:  Diagnosis Date  . Asthma   . Dupuytren's disease     Patient Active Problem List   Diagnosis Date Noted  . Allergic rhinitis 04/20/2015  . Airway hyperreactivity 04/20/2015  . Esophageal reflux 04/20/2015  . Plantar fascial fibromatosis 04/20/2015  . Contracture of palmar fascia (Dupuytren's) 01/02/2015  . Family history of malignant neoplasm of prostate 08/04/2005  . Hypercholesterolemia without hypertriglyceridemia 01/12/2005    Past Surgical History:  Procedure Laterality Date  . RHINOPLASTY    . TONSILLECTOMY         Home Medications    Prior to Admission medications   Medication Sig Start Date End Date Taking?  Authorizing Provider  amoxicillin-clavulanate (AUGMENTIN) 875-125 MG tablet Take 1 tablet by mouth 2 (two) times daily. 04/16/16   Frederich Cha, MD  diazepam (VALIUM) 2 MG tablet Take 1 tablet (2 mg total) by mouth 3 (three) times daily. 09/22/15   Lorin Picket, PA-C  Multiple Vitamin (MULTIVITAMIN) capsule Take 1 capsule by mouth daily.    Historical Provider, MD  naproxen (NAPROSYN) 500 MG tablet Take 1 tablet (500 mg total) by mouth 2 (two) times daily with a meal. 09/22/15   Lorin Picket, PA-C  predniSONE (STERAPRED UNI-PAK 21 TAB) 10 MG (21) TBPK tablet Sig 6 tablet day 1, 5 tablets day 2, 4 tablets day 3,,3tablets day 4, 2 tablets day 5, 1 tablet day 6 take all tablets orally 04/16/16   Frederich Cha, MD  Triamcinolone Acetonide (NASACORT ALLERGY 24HR NA) Place into the nose.    Historical Provider, MD    Family History Family History  Problem Relation Age of Onset  . Emphysema Mother   . Hypertension Father     Social History Social History  Substance Use Topics  . Smoking status: Never Smoker  . Smokeless tobacco: Never Used  . Alcohol use No     Allergies   Patient has no known allergies.   Review of Systems Review of Systems  Constitutional: Positive for fatigue.  HENT: Positive for postnasal drip, rhinorrhea and sinus pressure.   Respiratory: Positive for cough.   All other systems reviewed and are negative.    Physical Exam Triage Vital Signs  ED Triage Vitals  Enc Vitals Group     BP 04/16/16 1809 114/69     Pulse Rate 04/16/16 1809 96     Resp 04/16/16 1809 18     Temp 04/16/16 1809 98.7 F (37.1 C)     Temp Source 04/16/16 1809 Oral     SpO2 04/16/16 1809 98 %     Weight 04/16/16 1810 186 lb (84.4 kg)     Height 04/16/16 1810 5\' 6"  (1.676 m)     Head Circumference --      Peak Flow --      Pain Score 04/16/16 1811 2     Pain Loc --      Pain Edu? --      Excl. in [place]? --    No data found.   Updated Vital Signs BP 114/69 (BP Location: Left Arm)    Pulse 96   Temp 98.7 F (37.1 C) (Oral)   Resp 18   Ht 5\' 6"  (1.676 m)   Wt 186 lb (84.4 kg)   SpO2 98%   BMI 30.02 kg/m   Visual Acuity Right Eye Distance:   Left Eye Distance:   Bilateral Distance:    Right Eye Near:   Left Eye Near:    Bilateral Near:     Physical Exam  Constitutional: He appears well-developed and well-nourished. No distress.  HENT:  Head: Normocephalic and atraumatic.  Right Ear: Hearing, tympanic membrane and external ear normal. A foreign body is present.  Left Ear: Hearing, tympanic membrane and external ear normal. A foreign body is present.  Nose: Mucosal edema, rhinorrhea and sinus tenderness present. Right sinus exhibits no maxillary sinus tenderness and no frontal sinus tenderness. Left sinus exhibits no maxillary sinus tenderness and no frontal sinus tenderness.  Mouth/Throat: Uvula is midline, oropharynx is clear and moist and mucous membranes are normal.  Eyes: Pupils are equal, round, and reactive to light.  Neck: Normal range of motion. Neck supple. No tracheal deviation present. No thyromegaly present.  Cardiovascular: Normal rate, regular rhythm and normal heart sounds.   Pulmonary/Chest: Effort normal and breath sounds normal.  Musculoskeletal: Normal range of motion. He exhibits no edema.  Neurological: He is alert.  Skin: Skin is warm. He is not diaphoretic.  Psychiatric: He has a normal mood and affect.  Vitals reviewed.    UC Treatments / Results  Labs (all labs ordered are listed, but only abnormal results are displayed) Labs Reviewed - No data to display  EKG  EKG Interpretation None       Radiology No results found.  Procedures Procedures (including critical care time)  Medications Ordered in UC Medications - No data to display   Initial Impression / Assessment and Plan / UC Course  I have reviewed the triage vital signs and the nursing notes.  Pertinent labs & imaging results that were available during my  care of the patient were reviewed by me and considered in my medical decision making (see chart for details).     His history is consistent allergies progressed into a sinus infection. Examination of the excess mild cerumen in both ears was really unremarkable. The because of his symptoms and the duration will, round of Augmentin and a 60 course of prednisone will have him follow his PCP in 2 weeks not better.  Final Clinical Impressions(s) / UC Diagnoses   Final diagnoses:  Other acute sinusitis, recurrence not specified  Acute seasonal allergic rhinitis due to pollen  New Prescriptions Discharge Medication List as of 04/16/2016  6:38 PM    START taking these medications   Details  amoxicillin-clavulanate (AUGMENTIN) 875-125 MG tablet Take 1 tablet by mouth 2 (two) times daily., Starting Thu 04/16/2016, Normal    predniSONE (STERAPRED UNI-PAK 21 TAB) 10 MG (21) TBPK tablet Sig 6 tablet day 1, 5 tablets day 2, 4 tablets day 3,,3tablets day 4, 2 tablets day 5, 1 tablet day 6 take all tablets orally, Normal         Note: This dictation was prepared with Dragon dictation along with smaller phrase technology. Any transcriptional errors that result from this process are unintentional.   Frederich Cha, MD 04/16/16 [phone]

## 2016-04-20 ENCOUNTER — Encounter: Payer: Self-pay | Admitting: *Deleted

## 2016-04-22 ENCOUNTER — Encounter: Payer: Self-pay | Admitting: Anesthesiology

## 2016-04-24 ENCOUNTER — Encounter
Admission: RE | Disposition: A | Discharge status: Home / Self Care | Payer: Self-pay | Source: Ambulatory Visit | Attending: Gastroenterology

## 2016-04-24 ENCOUNTER — Ambulatory Visit
Admission: RE | Admit: 2016-04-24 | Discharge: 2016-04-24 | Disposition: A | Discharge status: Home / Self Care | Payer: BLUE CROSS/BLUE SHIELD | Source: Ambulatory Visit | Attending: Gastroenterology | Admitting: Gastroenterology

## 2016-04-24 ENCOUNTER — Ambulatory Visit: Payer: BLUE CROSS/BLUE SHIELD | Admitting: Anesthesiology

## 2016-04-24 DIAGNOSIS — M4690 Unspecified inflammatory spondylopathy, site unspecified: Secondary | ICD-10-CM | POA: Diagnosis not present

## 2016-04-24 DIAGNOSIS — K5730 Diverticulosis of large intestine without perforation or abscess without bleeding: Secondary | ICD-10-CM | POA: Diagnosis not present

## 2016-04-24 DIAGNOSIS — Z7952 Long term (current) use of systemic steroids: Secondary | ICD-10-CM | POA: Insufficient documentation

## 2016-04-24 DIAGNOSIS — D124 Benign neoplasm of descending colon: Secondary | ICD-10-CM

## 2016-04-24 DIAGNOSIS — K219 Gastro-esophageal reflux disease without esophagitis: Secondary | ICD-10-CM | POA: Diagnosis not present

## 2016-04-24 DIAGNOSIS — Z1211 Encounter for screening for malignant neoplasm of colon: Secondary | ICD-10-CM | POA: Diagnosis not present

## 2016-04-24 DIAGNOSIS — K648 Other hemorrhoids: Secondary | ICD-10-CM | POA: Insufficient documentation

## 2016-04-24 DIAGNOSIS — J45909 Unspecified asthma, uncomplicated: Secondary | ICD-10-CM | POA: Insufficient documentation

## 2016-04-24 DIAGNOSIS — Z79899 Other long term (current) drug therapy: Secondary | ICD-10-CM | POA: Diagnosis not present

## 2016-04-24 HISTORY — DX: Unspecified osteoarthritis, unspecified site: M19.90

## 2016-04-24 HISTORY — DX: Bronchitis, not specified as acute or chronic: J40

## 2016-04-24 HISTORY — PX: COLONOSCOPY WITH PROPOFOL: SHX5780

## 2016-04-24 HISTORY — PX: POLYPECTOMY: SHX5525

## 2016-04-24 SURGERY — COLONOSCOPY WITH PROPOFOL
Anesthesia: General | Wound class: Contaminated

## 2016-04-24 MED ORDER — STERILE WATER FOR IRRIGATION IR SOLN
Status: DC | PRN
  Administered 2016-04-24: 10:00:00

## 2016-04-24 MED ORDER — PROPOFOL 10 MG/ML IV BOLUS
INTRAVENOUS | Status: DC | PRN
  Administered 2016-04-24: 50 mg via INTRAVENOUS
  Administered 2016-04-24: 100 mg via INTRAVENOUS
  Administered 2016-04-24 (×3): 50 mg via INTRAVENOUS

## 2016-04-24 MED ORDER — LIDOCAINE HCL (CARDIAC) 20 MG/ML IV SOLN
INTRAVENOUS | Status: DC | PRN
  Administered 2016-04-24: 50 mg via INTRAVENOUS

## 2016-04-24 MED ORDER — LACTATED RINGERS IV SOLN
INTRAVENOUS | Status: DC
  Administered 2016-04-24: 09:00:00 via INTRAVENOUS

## 2016-04-24 SURGICAL SUPPLY — 23 items

## 2016-04-24 NOTE — H&P (Signed)
  Lucilla Lame, MD [address], [place] [address] Phone: [phone] Fax : [phone]  Primary Care Physician:  Marcello Fennel, MD Primary Gastroenterologist:  Dr. Allen Norris  Pre-Procedure History & Physical: HPI:  Micheal Yoder is a 51 y.o. male is here for a screening colonoscopy.   Past Medical History:  Diagnosis Date  . Arthritis    back  . Asthma   . Bronchitis 04/16/2016   only lingering cough left(04/20/16)  . Dupuytren's disease     Past Surgical History:  Procedure Laterality Date  . RHINOPLASTY    . TONSILLECTOMY      Prior to Admission medications   Medication Sig Start Date End Date Taking? Authorizing Provider  amoxicillin-clavulanate (AUGMENTIN) 875-125 MG tablet Take 1 tablet by mouth 2 (two) times daily. 04/16/16  Yes Frederich Cha, MD  Multiple Vitamin (MULTIVITAMIN) capsule Take 1 capsule by mouth daily.   Yes Historical Provider, MD  naproxen (NAPROSYN) 500 MG tablet Take 1 tablet (500 mg total) by mouth 2 (two) times daily with a meal. 09/22/15  Yes Lorin Picket, PA-C  predniSONE (STERAPRED UNI-PAK 21 TAB) 10 MG (21) TBPK tablet Sig 6 tablet day 1, 5 tablets day 2, 4 tablets day 3,,3tablets day 4, 2 tablets day 5, 1 tablet day 6 take all tablets orally 04/16/16  Yes Frederich Cha, MD  pseudoephedrine (SUDAFED) 120 MG 12 hr tablet Take 120 mg by mouth 2 (two) times daily.   Yes Historical Provider, MD  Triamcinolone Acetonide (NASACORT ALLERGY 24HR NA) Place into the nose.   Yes Historical Provider, MD  diazepam (VALIUM) 2 MG tablet Take 1 tablet (2 mg total) by mouth 3 (three) times daily. Patient not taking: Reported on 04/20/2016 09/22/15   Lorin Picket, PA-C    Allergies as of 04/09/2016  . (No Known Allergies)    Family History  Problem Relation Age of Onset  . Emphysema Mother   . Hypertension Father     Social History   Social History  . Marital status: Married    Spouse name: N/A  . Number of children: N/A  .  Years of education: N/A   Occupational History  . Not on file.   Social History Main Topics  . Smoking status: Never Smoker  . Smokeless tobacco: Never Used  . Alcohol use No  . Drug use: No  . Sexual activity: Not on file   Other Topics Concern  . Not on file   Social History Narrative  . No narrative on file    Review of Systems: See HPI, otherwise negative ROS  Physical Exam: BP 103/84   Pulse 74   Temp 97 F (36.1 C) (Temporal)   Resp 16   Ht 5\' 6"  (1.676 m)   Wt 190 lb (86.2 kg)   SpO2 97%   BMI 30.67 kg/m  General:   Alert,  pleasant and cooperative in NAD Head:  Normocephalic and atraumatic. Neck:  Supple; no masses or thyromegaly. Lungs:  Clear throughout to auscultation.    Heart:  Regular rate and rhythm. Abdomen:  Soft, nontender and nondistended. Normal bowel sounds, without guarding, and without rebound.   Neurologic:  Alert and  oriented x4;  grossly normal neurologically.  Impression/Plan: Micheal Yoder is now here to undergo a screening colonoscopy.  Risks, benefits, and alternatives regarding colonoscopy have been reviewed with the patient.  Questions have been answered.  All parties agreeable.

## 2016-04-24 NOTE — Anesthesia Postprocedure Evaluation (Signed)
Anesthesia Post Note  Patient: Micheal Yoder  Procedure(s) Performed: Procedure(s) (LRB): COLONOSCOPY WITH PROPOFOL (N/A) POLYPECTOMY (N/A)  Patient location during evaluation: PACU Anesthesia Type: General Level of consciousness: awake and alert Pain management: pain level controlled Vital Signs Assessment: post-procedure vital signs reviewed and stable Respiratory status: spontaneous breathing, nonlabored ventilation and respiratory function stable Cardiovascular status: stable Postop Assessment: no signs of nausea or vomiting Anesthetic complications: no    Veda Canning

## 2016-04-24 NOTE — Anesthesia Procedure Notes (Signed)
Procedure Name: MAC Date/Time: 04/24/2016 10:12 AM Performed by: Janna Arch Pre-anesthesia Checklist: Patient identified, Emergency Drugs available, Suction available and Patient being monitored Patient Re-evaluated:Patient Re-evaluated prior to inductionOxygen Delivery Method: Nasal cannula

## 2016-04-24 NOTE — Transfer of Care (Signed)
Immediate Anesthesia Transfer of Care Note  Patient: Micheal Yoder [facility]  Procedure(s) Performed: Procedure(s): COLONOSCOPY WITH PROPOFOL (N/A) POLYPECTOMY (N/A)  Patient Location: PACU  Anesthesia Type: General  Level of Consciousness: awake, alert  and patient cooperative  Airway and Oxygen Therapy: Patient Spontanous Breathing and Patient connected to supplemental oxygen  Post-op Assessment: Post-op Vital signs reviewed, Patient's Cardiovascular Status Stable, Respiratory Function Stable, Patent Airway and No signs of Nausea or vomiting  Post-op Vital Signs: Reviewed and stable  Complications: No apparent anesthesia complications

## 2016-04-24 NOTE — Anesthesia Preprocedure Evaluation (Signed)
Anesthesia Evaluation  Patient identified by MRN, date of birth, ID band Patient awake    Reviewed: Allergy & Precautions, H&P , NPO status   Airway Mallampati: I       Dental  (+) Teeth Intact   Pulmonary asthma ,  Recent bronchitis   breath sounds clear to auscultation       Cardiovascular negative cardio ROS   Rhythm:Regular Rate:Normal     Neuro/Psych    GI/Hepatic GERD  ,  Endo/Other  BMi 30  Renal/GU      Musculoskeletal  (+) Arthritis ,   Abdominal   Peds  Hematology   Anesthesia Other Findings   Reproductive/Obstetrics                            Anesthesia Physical Anesthesia Plan  ASA: II  Anesthesia Plan: General   Post-op Pain Management:    Induction:   Airway Management Planned: Natural Airway and Nasal Cannula  Additional Equipment:   Intra-op Plan:   Post-operative Plan:   Informed Consent: I have reviewed the patients History and Physical, chart, labs and discussed the procedure including the risks, benefits and alternatives for the proposed anesthesia with the patient or authorized representative who has indicated his/her understanding and acceptance.     Plan Discussed with: CRNA  Anesthesia Plan Comments:         Anesthesia Quick Evaluation

## 2016-04-24 NOTE — Discharge Instructions (Signed)

## 2016-04-24 NOTE — Op Note (Signed)
[facility] Patient Name: Micheal Yoder Procedure Date: 04/24/2016 9:59 AM MRN: [id number] Account #: [id number] Date of Birth: 1965-08-31 Admit Type: Outpatient Age: 51 Room: [facility] OR ROOM 01 Gender: Male Note Status: Finalized Procedure:            Colonoscopy Indications:          Screening for colorectal malignant neoplasm Providers:            Lucilla Lame MD, MD Referring MD:         Sofie Hartigan (Referring MD) Medicines:            Propofol per Anesthesia Complications:        No immediate complications. Procedure:            Pre-Anesthesia Assessment:                       - Prior to the procedure, a History and Physical was                        performed, and patient medications and allergies were                        reviewed. The patient's tolerance of previous                        anesthesia was also reviewed. The risks and benefits of                        the procedure and the sedation options and risks were                        discussed with the patient. All questions were                        answered, and informed consent was obtained. Prior                        Anticoagulants: The patient has taken no previous                        anticoagulant or antiplatelet agents. ASA Grade                        Assessment: II - A patient with mild systemic disease.                        After reviewing the risks and benefits, the patient was                        deemed in satisfactory condition to undergo the                        procedure.                       After obtaining informed consent, the colonoscope was                        passed under direct vision. Throughout the procedure,  the patient's blood pressure, pulse, and oxygen                        saturations were monitored continuously. The [place] [id number]) was introduced through the                     anus and advanced to the the cecum, identified by                        appendiceal orifice and ileocecal valve. The                        colonoscopy was performed with ease. The patient                        tolerated the procedure well. The quality of the bowel                        preparation was excellent. Findings:      The perianal and digital rectal examinations were normal.      Two sessile polyps were found in the descending colon. The polyps were 3       to 4 mm in size. These polyps were removed with a cold snare. Resection       and retrieval were complete.      A few small-mouthed diverticula were found in the entire colon.      Non-bleeding internal hemorrhoids were found during retroflexion. The       hemorrhoids were Grade II (internal hemorrhoids that prolapse but reduce       spontaneously). Impression:           - Two 3 to 4 mm polyps in the descending colon, removed                        with a cold snare. Resected and retrieved.                       - Diverticulosis in the entire examined colon.                       - Non-bleeding internal hemorrhoids. Recommendation:       - Discharge patient to home.                       - Resume previous diet.                       - Continue present medications. Procedure Code(s):    --- Professional ---                       [phone], Colonoscopy, flexible; with removal of tumor(s),                        polyp(s), or other lesion(s) by snare technique Diagnosis Code(s):    --- Professional ---                       Z12.11, Encounter for screening for malignant  neoplasm                        of colon                       D12.4, Benign neoplasm of descending colon CPT copyright 2016 American Medical Association. All rights reserved. The codes documented in this report are preliminary and upon coder review may  be revised to meet current compliance requirements. Lucilla Lame MD, MD 04/24/2016 10:35:12  AM This report has been signed electronically. Number of Addenda: 0 Note Initiated On: 04/24/2016 9:59 AM Scope Withdrawal Time: 0 hours 9 minutes 6 seconds  Total Procedure Duration: 0 hours 13 minutes 44 seconds       [facility]

## 2016-04-27 ENCOUNTER — Encounter: Payer: Self-pay | Admitting: Gastroenterology

## 2016-04-28 ENCOUNTER — Encounter: Payer: Self-pay | Admitting: Gastroenterology

## 2016-04-29 ENCOUNTER — Other Ambulatory Visit: Payer: Self-pay

## 2016-05-04 ENCOUNTER — Other Ambulatory Visit: Payer: Self-pay

## 2016-05-15 ENCOUNTER — Ambulatory Visit
Admission: EM | Admit: 2016-05-15 | Discharge: 2016-05-15 | Disposition: A | Discharge status: Home / Self Care | Payer: BLUE CROSS/BLUE SHIELD | Attending: Family Medicine | Admitting: Family Medicine

## 2016-05-15 DIAGNOSIS — H6121 Impacted cerumen, right ear: Secondary | ICD-10-CM

## 2016-05-15 DIAGNOSIS — H9191 Unspecified hearing loss, right ear: Secondary | ICD-10-CM | POA: Diagnosis not present

## 2016-05-15 NOTE — ED Triage Notes (Signed)
Pt states he has wax build up in his right ear and he is having difficulty hearing.

## 2016-05-15 NOTE — ED Provider Notes (Signed)
[facility]    CSN: [id number] Arrival date & time: 05/15/16  1731     History   Chief Complaint Chief Complaint  Patient presents with  . Hearing Problem    right ear    HPI Micheal Yoder is a 51 y.o. male.    Patient is a  51 year old white male with hearing impairment the right ear. He has had trouble hearing out of his right ear the last few days. We had his PE he was told he had a lot of wax buildup in the right ear. He came into have his ears irrigated. Past history  Includes arthritis asthma bronchitis and Dupuytren's DX.Marland Kitchen He's had colonoscopy with polypectomy rhinoplasty and tonsillectomy. Family history is positive for hypertension and emphysema. He's never smoked and has no known drug allergies   The history is provided by the patient. No language interpreter was used.  Ear Fullness  This is a new problem. The current episode started more than 2 days ago. The problem occurs constantly. The problem has been gradually worsening. Pertinent negatives include no chest pain, no abdominal pain, no headaches and no shortness of breath. Nothing aggravates the symptoms. Nothing relieves the symptoms. He has tried nothing for the symptoms. The treatment provided no relief.    Past Medical History:  Diagnosis Date  . Arthritis    back  . Asthma   . Bronchitis 04/16/2016   only lingering cough left(04/20/16)  . Dupuytren's disease     Patient Active Problem List   Diagnosis Date Noted  . Special screening for malignant neoplasms, colon   . Benign neoplasm of descending colon   . Allergic rhinitis 04/20/2015  . Airway hyperreactivity 04/20/2015  . Esophageal reflux 04/20/2015  . Plantar fascial fibromatosis 04/20/2015  . Contracture of palmar fascia (Dupuytren's) 01/02/2015  . Family history of malignant neoplasm of prostate 08/04/2005  . Hypercholesterolemia without hypertriglyceridemia 01/12/2005    Past Surgical History:  Procedure Laterality Date  .  COLONOSCOPY WITH PROPOFOL N/A 04/24/2016   Procedure: COLONOSCOPY WITH PROPOFOL;  Surgeon: Lucilla Lame, MD;  Location: [place];  Service: Endoscopy;  Laterality: N/A;  . POLYPECTOMY N/A 04/24/2016   Procedure: POLYPECTOMY;  Surgeon: Lucilla Lame, MD;  Location: [place];  Service: Endoscopy;  Laterality: N/A;  . RHINOPLASTY    . TONSILLECTOMY         Home Medications    Prior to Admission medications   Medication Sig Start Date End Date Taking? Authorizing Provider  amoxicillin-clavulanate (AUGMENTIN) 875-125 MG tablet Take 1 tablet by mouth 2 (two) times daily. 04/16/16   Frederich Cha, MD  diazepam (VALIUM) 2 MG tablet Take 1 tablet (2 mg total) by mouth 3 (three) times daily. Patient not taking: Reported on 04/20/2016 09/22/15   Lorin Picket, PA-C  Multiple Vitamin (MULTIVITAMIN) capsule Take 1 capsule by mouth daily.    Historical Provider, MD  naproxen (NAPROSYN) 500 MG tablet Take 1 tablet (500 mg total) by mouth 2 (two) times daily with a meal. 09/22/15   Lorin Picket, PA-C  predniSONE (STERAPRED UNI-PAK 21 TAB) 10 MG (21) TBPK tablet Sig 6 tablet day 1, 5 tablets day 2, 4 tablets day 3,,3tablets day 4, 2 tablets day 5, 1 tablet day 6 take all tablets orally 04/16/16   Frederich Cha, MD  pseudoephedrine (SUDAFED) 120 MG 12 hr tablet Take 120 mg by mouth 2 (two) times daily.    Historical Provider, MD  Triamcinolone Acetonide (NASACORT ALLERGY 24HR NA)  Place into the nose.    Historical Provider, MD    Family History Family History  Problem Relation Age of Onset  . Emphysema Mother   . Hypertension Father     Social History Social History  Substance Use Topics  . Smoking status: Never Smoker  . Smokeless tobacco: Never Used  . Alcohol use No     Allergies   Patient has no known allergies.   Review of Systems Review of Systems  HENT: Positive for hearing loss. Negative for ear discharge, ear pain and facial swelling.   Respiratory: Negative for  shortness of breath.   Cardiovascular: Negative for chest pain.  Gastrointestinal: Negative for abdominal pain.  Skin: Negative for color change and pallor.  Neurological: Negative for headaches.  All other systems reviewed and are negative.    Physical Exam Triage Vital Signs ED Triage Vitals [05/15/16 1744]  Enc Vitals Group     BP 104/65     Pulse Rate 81     Resp 18     Temp 98.5 F (36.9 C)     Temp Source Oral     SpO2 97 %     Weight 190 lb (86.2 kg)     Height 5\' 6"  (1.676 m)     Head Circumference      Peak Flow      Pain Score 0     Pain Loc      Pain Edu?      Excl. in [place]?    No data found.   Updated Vital Signs BP 104/65 (BP Location: Left Arm)   Pulse 81   Temp 98.5 F (36.9 C) (Oral)   Resp 18   Ht 5\' 6"  (1.676 m)   Wt 190 lb (86.2 kg)   SpO2 97%   BMI 30.67 kg/m   Visual Acuity Right Eye Distance:   Left Eye Distance:   Bilateral Distance:    Right Eye Near:   Left Eye Near:    Bilateral Near:     Physical Exam  Constitutional: He appears well-developed and well-nourished.  HENT:  Head: Normocephalic and atraumatic.  Right Ear: A foreign body is present. Decreased hearing is noted.  Left Ear: Hearing, tympanic membrane, external ear and ear canal normal.  Nose: Nose normal.  Mouth/Throat: Uvula is midline and oropharynx is clear and moist.  Eyes: Conjunctivae and EOM are normal. Pupils are equal, round, and reactive to light.  Neck: Neck supple.  Pulmonary/Chest: Effort normal.  Musculoskeletal: Normal range of motion.  Lymphadenopathy:    He has cervical adenopathy.  Neurological: He is alert.  Skin: Skin is warm.  Psychiatric: He has a normal mood and affect.  Vitals reviewed.    UC Treatments / Results  Labs (all labs ordered are listed, but only abnormal results are displayed) Labs Reviewed - No data to display  EKG  EKG Interpretation None       Radiology No results found.  Procedures Procedures (including  critical care time)  Medications Ordered in UC Medications - No data to display   Initial Impression / Assessment and Plan / UC Course  I have reviewed the triage vital signs and the nursing notes.  Pertinent labs & imaging results that were available during my care of the patient were reviewed by me and considered in my medical decision making (see chart for details).      after irrigation wax in the left ear since removed patient feels much better he can  hear now or not placed in further medication but release patient to home and follow-up as needed.  Final Clinical Impressions(s) / UC Diagnoses   Final diagnoses:  Hearing loss of right ear due to cerumen impaction    New Prescriptions New Prescriptions   No medications on file     Frederich Cha, MD 05/15/16 1805

## 2016-10-04 ENCOUNTER — Encounter: Payer: Self-pay | Admitting: *Deleted

## 2016-10-04 ENCOUNTER — Ambulatory Visit
Admission: EM | Admit: 2016-10-04 | Discharge: 2016-10-04 | Disposition: A | Discharge status: Home / Self Care | Payer: BLUE CROSS/BLUE SHIELD | Attending: Emergency Medicine | Admitting: Emergency Medicine

## 2016-10-04 DIAGNOSIS — H60503 Unspecified acute noninfective otitis externa, bilateral: Secondary | ICD-10-CM

## 2016-10-04 MED ORDER — IBUPROFEN 600 MG PO TABS: 600.0000 mg | ORAL_TABLET | Freq: Four times a day (QID) | ORAL | 0 refills | Status: DC | PRN

## 2016-10-04 MED ORDER — OFLOXACIN 0.3 % OT SOLN: 5.0000 [drp] | Freq: Two times a day (BID) | OTIC | 0 refills | Status: DC

## 2016-10-04 NOTE — ED Triage Notes (Signed)
Bilat ear pain x2 days. Denies drainage and fever. Worse today.

## 2016-10-04 NOTE — ED Provider Notes (Signed)
HPI  SUBJECTIVE:  Micheal Yoder is a 51 y.o. male who presents with constant sore, dull, throbbing bilateral ear pain worse in the left and right starting 2 days ago. He tried ibuprofen with improvement in his pain. Symptoms are worse with lying on his side. It is not associated with yawning, chewing, eating. He denies fevers, URI symptoms, foreign body insertion, earbud earplug use. No change in his hearing, otorrhea. He does not grind his teeth at night. No recent swimming, facial rash, tinnitus, ear popping, sore throat. No antibiotics in the past month. No antipyretic in the past 6-8 hours. He has a past medical history of recurrent otitis externa. No history of diabetes, hypertension, TMJ disorder, shingles. FGH:[id number], Micheal Noa, MD   Past Medical History:  Diagnosis Date  . Arthritis    back  . Asthma   . Bronchitis 04/16/2016   only lingering cough left(04/20/16)  . Dupuytren's disease     Past Surgical History:  Procedure Laterality Date  . COLONOSCOPY WITH PROPOFOL N/A 04/24/2016   Procedure: COLONOSCOPY WITH PROPOFOL;  Surgeon: Micheal Lame, MD;  Location: [place];  Service: Endoscopy;  Laterality: N/A;  . POLYPECTOMY N/A 04/24/2016   Procedure: POLYPECTOMY;  Surgeon: Micheal Lame, MD;  Location: [place];  Service: Endoscopy;  Laterality: N/A;  . RHINOPLASTY    . TONSILLECTOMY      Family History  Problem Relation Age of Onset  . Emphysema Mother   . Hypertension Father     Social History  Substance Use Topics  . Smoking status: Never Smoker  . Smokeless tobacco: Never Used  . Alcohol use No    No current facility-administered medications for this encounter.   Current Outpatient Prescriptions:  Marland Kitchen  Multiple Vitamin (MULTIVITAMIN) capsule, Take 1 capsule by mouth daily., Disp: , Rfl:  .  ibuprofen (ADVIL,MOTRIN) 600 MG tablet, Take 1 tablet (600 mg total) by mouth every 6 (six) hours as needed., Disp: 30 tablet, Rfl: 0 .  ofloxacin  (FLOXIN) 0.3 % OTIC solution, Place 5 drops into both ears 2 (two) times daily. X 7 days, Disp: 10 mL, Rfl: 0  No Known Allergies   ROS  As noted in HPI.   Physical Exam  BP 108/71 (BP Location: Left Arm)   Temp 98.1 F (36.7 C) (Oral)   Resp 16   Ht 5\' 6"  (1.676 m)   Wt 190 lb (86.2 kg)   SpO2 99%   BMI 30.67 kg/m   Constitutional: Well developed, well nourished, no acute distress Eyes:  EOMI, conjunctiva normal bilaterally HENT: Normocephalic, atraumatic,mucus membranes moist.  Left ear. External ear normal. No tenderness over the mastoid process. Pain with traction on pinna. No pain with palpation of tragus. External ear canal slightly swollen but patent, TMs normal. No crepitus, tenderness at the TMJ. Right ear. External ear normal. No tenderness over the mastoid process. Pain with traction on pinna. No pain with palpation of tragus. External ear canal normal, TMs normal. No crepitus, tenderness at the TMJ. Neck: No cervical lymphadenopathy Respiratory: Normal inspiratory effort Cardiovascular: Normal rate GI: nondistended skin: No rash, skin intact Musculoskeletal: no deformities Neurologic: Alert & oriented x 3, no focal neuro deficits Psychiatric: Speech and behavior appropriate   ED Course   Medications - No data to display  No orders of the defined types were placed in this encounter.   No results found for this or any previous visit (from the past 24 hour(s)). No results found.  ED Clinical Impression  Acute otitis externa of both ears, unspecified type   ED Assessment/Plan  Presentation consistent with otitis externa. Home with Cipro eardrops, take ibuprofen 600 mg 1 g of Tylenol 3-4 times a day as needed for pain. Follow-up with PMD as needed. Discussed  MDM, plan and followup with patient. patient  agrees with plan.   Meds ordered this encounter  Medications  . ofloxacin (FLOXIN) 0.3 % OTIC solution    Sig: Place 5 drops into both ears 2 (two)  times daily. X 7 days    Dispense:  10 mL    Refill:  0  . ibuprofen (ADVIL,MOTRIN) 600 MG tablet    Sig: Take 1 tablet (600 mg total) by mouth every 6 (six) hours as needed.    Dispense:  30 tablet    Refill:  0    *This clinic note was created using [employer]. Therefore, there may be occasional mistakes despite careful proofreading.  ?   Micheal Ripple, MD 10/04/16 [phone]

## 2016-10-04 NOTE — Discharge Instructions (Signed)
take ibuprofen 600 mg 1 g of Tylenol 3-4 times a day as needed for pain.

## 2016-11-10 ENCOUNTER — Encounter: Payer: Self-pay | Admitting: Emergency Medicine

## 2016-11-10 ENCOUNTER — Ambulatory Visit
Admission: EM | Admit: 2016-11-10 | Discharge: 2016-11-10 | Disposition: A | Discharge status: Home / Self Care | Payer: BLUE CROSS/BLUE SHIELD | Attending: Family Medicine | Admitting: Family Medicine

## 2016-11-10 DIAGNOSIS — H9203 Otalgia, bilateral: Secondary | ICD-10-CM | POA: Diagnosis not present

## 2016-11-10 DIAGNOSIS — J0100 Acute maxillary sinusitis, unspecified: Secondary | ICD-10-CM

## 2016-11-10 MED ORDER — AMOXICILLIN-POT CLAVULANATE 875-125 MG PO TABS: 1.0000 | ORAL_TABLET | Freq: Two times a day (BID) | ORAL | 0 refills | Status: DC

## 2016-11-10 NOTE — Discharge Instructions (Signed)
Take medication as prescribed. Rest. Drink plenty of fluids.  ° °Follow up with your primary care physician this week as needed. Return to Urgent care for new or worsening concerns.  ° °

## 2016-11-10 NOTE — ED Triage Notes (Signed)
Patient in tonight c/o runny nose x 2 weeks. Patient also states that his ears feel full upon waking each morning. Patient denies fever. Patient has tried OTC Dayquil.

## 2016-11-10 NOTE — ED Provider Notes (Signed)
[facility] ____________________________________________  Time seen: Approximately 8:00 PM  I have reviewed the triage vital signs and the nursing notes.   HISTORY  Chief Complaint Nasal Congestion   HPI Micheal Yoder is a 51 y.o. male presented for evaluation of 2-3 weeks of runny nose, nasal congestion, sinus pressure and postnasal drainage.  States occasional cough.  States bilateral intermittent ear discomfort and some itching associated with the ear discomfort.  States her discomfort varies between different years.  Denies drainage.  States symptoms unresolved with over-the-counter DayQuil, Sudafed and nasal sprays.  Reports continues to eat and drink well.  States daughter recently with similar complaints.  Denies other aggravating or alleviating factors.  States persistent sinus pressure, mild currently.  Denies other aggravating or alleviating factors.  Denies other complaints.  Denies chest pain, shortness of breath, abdominal pain or rash. Denies recent sickness. Denies recent oral antibiotic use.  Reports was seen in urgent care approximately 1 month ago for right otitis externa treated with ofloxacin drops.  Sofie Hartigan, MD: PCP   Past Medical History:  Diagnosis Date  . Arthritis    back  . Asthma   . Bronchitis 04/16/2016   only lingering cough left(04/20/16)  . Dupuytren's disease     Patient Active Problem List   Diagnosis Date Noted  . Special screening for malignant neoplasms, colon   . Benign neoplasm of descending colon   . Allergic rhinitis 04/20/2015  . Airway hyperreactivity 04/20/2015  . Esophageal reflux 04/20/2015  . Plantar fascial fibromatosis 04/20/2015  . Contracture of palmar fascia (Dupuytren's) 01/02/2015  . Family history of malignant neoplasm of prostate 08/04/2005  . Hypercholesterolemia without hypertriglyceridemia 01/12/2005    Past Surgical History:  Procedure Laterality Date  . COLONOSCOPY WITH PROPOFOL N/A  04/24/2016   Procedure: COLONOSCOPY WITH PROPOFOL;  Surgeon: Lucilla Lame, MD;  Location: [place];  Service: Endoscopy;  Laterality: N/A;  . POLYPECTOMY N/A 04/24/2016   Procedure: POLYPECTOMY;  Surgeon: Lucilla Lame, MD;  Location: [place];  Service: Endoscopy;  Laterality: N/A;  . RHINOPLASTY    . TONSILLECTOMY       No current facility-administered medications for this encounter.   Current Outpatient Prescriptions:  .  amoxicillin-clavulanate (AUGMENTIN) 875-125 MG tablet, Take 1 tablet by mouth every 12 (twelve) hours., Disp: 20 tablet, Rfl: 0 .  ibuprofen (ADVIL,MOTRIN) 600 MG tablet, Take 1 tablet (600 mg total) by mouth every 6 (six) hours as needed., Disp: 30 tablet, Rfl: 0 .  Multiple Vitamin (MULTIVITAMIN) capsule, Take 1 capsule by mouth daily., Disp: , Rfl:  .  ofloxacin (FLOXIN) 0.3 % OTIC solution, Place 5 drops into both ears 2 (two) times daily. X 7 days, Disp: 10 mL, Rfl: 0  Allergies Patient has no known allergies.  Family History  Problem Relation Age of Onset  . Emphysema Mother   . Hypertension Father     Social History Social History  Substance Use Topics  . Smoking status: Never Smoker  . Smokeless tobacco: Never Used  . Alcohol use No    Review of Systems Constitutional: No fever/chills ENT: No sore throat. As above.  Cardiovascular: Denies chest pain. Respiratory: Denies shortness of breath. Gastrointestinal: No abdominal pain.   Musculoskeletal: Negative for back pain. Skin: Negative for rash. Neurological: Negative for headaches, focal weakness or numbness.  ____________________________________________   PHYSICAL EXAM:  VITAL SIGNS: ED Triage Vitals  Enc Vitals Group     BP 11/10/16 1952 99/64  Pulse Rate 11/10/16 1952 77     Resp 11/10/16 1952 16     Temp 11/10/16 1952 98.6 F (37 C)     Temp Source 11/10/16 1952 Oral     SpO2 11/10/16 1952 96 %     Weight 11/10/16 1953 187 lb (84.8 kg)     Height 11/10/16  1953 5\' 6"  (1.676 m)     Head Circumference --      Peak Flow --      Pain Score 11/10/16 1953 2     Pain Loc --      Pain Edu? --      Excl. in [place]? --     Constitutional: Alert and oriented. Well appearing and in no acute distress. Eyes: Conjunctivae are normal. P Head: Atraumatic.Mild tenderness to palpation bilateral maxillary sinuses. Nontender frontal sinuses. No swelling. No erythema.   Ears: no erythema, normal TMs bilaterally.  No surrounding tenderness, swelling or erythema bilaterally.  Nose: nasal congestion with bilateral nasal turbinate erythema and edema.   Mouth/Throat: Mucous membranes are moist.  Oropharynx non-erythematous.No tonsillar swelling or exudate.  Neck: No stridor.  No cervical spine tenderness to palpation. Hematological/Lymphatic/Immunilogical: No cervical lymphadenopathy. Cardiovascular: Normal rate, regular rhythm. Grossly normal heart sounds.  Good peripheral circulation. Respiratory: Normal respiratory effort.  No retractions. No wheezes, rales or rhonchi. Good air movement.  Musculoskeletal: No cervical, thoracic or lumbar tenderness to palpation.  Neurologic:  Normal speech and language. No gait instability. Skin:  Skin is warm, dry and intact. No rash noted. Psychiatric: Mood and affect are normal. Speech and behavior are normal.  ___________________________________________   LABS (all labs ordered are listed, but only abnormal results are displayed)  Labs Reviewed - No data to display ____________________________________________   PROCEDURES Procedures   INITIAL IMPRESSION / ASSESSMENT AND PLAN / ED COURSE  Pertinent labs & imaging results that were available during my care of the patient were reviewed by me and considered in my medical decision making (see chart for details).  Well-appearing patient.  No acute distress.  Suspect maxillary sinusitis.  Will treat patient with oral Augmentin, continue home supportive care, rest, fluids,  follow-up as needed.Discussed indication, risks and benefits of medications with patient.  Discussed follow up with Primary care physician this week. Discussed follow up and return parameters including no resolution or any worsening concerns. Patient verbalized understanding and agreed to plan.   ____________________________________________   FINAL CLINICAL IMPRESSION(S) / ED DIAGNOSES  Final diagnoses:  Acute maxillary sinusitis, recurrence not specified  Otalgia of both ears     Discharge Medication List as of 11/10/2016  8:11 PM    START taking these medications   Details  amoxicillin-clavulanate (AUGMENTIN) 875-125 MG tablet Take 1 tablet by mouth every 12 (twelve) hours., Starting Tue 11/10/2016, Normal        Note: This dictation was prepared with Dragon dictation along with smaller phrase technology. Any transcriptional errors that result from this process are unintentional.         Marylene Land, NP 11/10/16 2043

## 2017-04-04 ENCOUNTER — Ambulatory Visit
Admission: EM | Admit: 2017-04-04 | Discharge: 2017-04-04 | Disposition: A | Discharge status: Home / Self Care | Payer: BLUE CROSS/BLUE SHIELD | Attending: Family Medicine | Admitting: Family Medicine

## 2017-04-04 ENCOUNTER — Other Ambulatory Visit: Payer: Self-pay

## 2017-04-04 DIAGNOSIS — S46002A Unspecified injury of muscle(s) and tendon(s) of the rotator cuff of left shoulder, initial encounter: Secondary | ICD-10-CM

## 2017-04-04 NOTE — Discharge Instructions (Signed)
Rest, heat/ice, over the counter ibuprofen 600mg  three times daily with food

## 2017-04-04 NOTE — ED Provider Notes (Signed)
[facility]    CSN: [id number] Arrival date & time: 04/04/17  1004     History   Chief Complaint Chief Complaint  Patient presents with  . Shoulder Pain    HPI Micheal Yoder is a 52 y.o. male.   52 yo male with a c/o left shoulder pain intermittently for 2 months. States does not recall a specific traumatic event. Denies any fevers, chills, rash, numbness/tingling.   The history is provided by the patient.  Shoulder Pain    Past Medical History:  Diagnosis Date  . Arthritis    back  . Asthma   . Bronchitis 04/16/2016   only lingering cough left(04/20/16)  . Dupuytren's disease     Patient Active Problem List   Diagnosis Date Noted  . Special screening for malignant neoplasms, colon   . Benign neoplasm of descending colon   . Allergic rhinitis 04/20/2015  . Airway hyperreactivity 04/20/2015  . Esophageal reflux 04/20/2015  . Plantar fascial fibromatosis 04/20/2015  . Contracture of palmar fascia (Dupuytren's) 01/02/2015  . Family history of malignant neoplasm of prostate 08/04/2005  . Hypercholesterolemia without hypertriglyceridemia 01/12/2005    Past Surgical History:  Procedure Laterality Date  . COLONOSCOPY WITH PROPOFOL N/A 04/24/2016   Procedure: COLONOSCOPY WITH PROPOFOL;  Surgeon: Lucilla Lame, MD;  Location: [place];  Service: Endoscopy;  Laterality: N/A;  . POLYPECTOMY N/A 04/24/2016   Procedure: POLYPECTOMY;  Surgeon: Lucilla Lame, MD;  Location: [place];  Service: Endoscopy;  Laterality: N/A;  . RHINOPLASTY    . TONSILLECTOMY         Home Medications    Prior to Admission medications   Medication Sig Start Date End Date Taking? Authorizing Provider  ibuprofen (ADVIL,MOTRIN) 600 MG tablet Take 1 tablet (600 mg total) by mouth every 6 (six) hours as needed. 10/04/16  Yes [name], MD  Multiple Vitamin (MULTIVITAMIN) capsule Take 1 capsule by mouth daily.   Yes [provider]    amoxicillin-clavulanate (AUGMENTIN) 875-125 MG tablet Take 1 tablet by mouth every 12 (twelve) hours. 11/10/16   Marylene Land, NP  ofloxacin (FLOXIN) 0.3 % OTIC solution Place 5 drops into both ears 2 (two) times daily. X 7 days 10/04/16   [name], MD    Family History Family History  Problem Relation Age of Onset  . Emphysema Mother   . Hypertension Father     Social History Social History   Tobacco Use  . Smoking status: Never Smoker  . Smokeless tobacco: Never Used  Substance Use Topics  . Alcohol use: No  . Drug use: No     Allergies   Patient has no known allergies.   Review of Systems Review of Systems   Physical Exam Triage Vital Signs ED Triage Vitals  Enc Vitals Group     BP 04/04/17 1035 98/63     Pulse Rate 04/04/17 1035 68     Resp --      Temp 04/04/17 1035 97.9 F (36.6 C)     Temp Source 04/04/17 1035 Oral     SpO2 04/04/17 1035 97 %     Weight 04/04/17 1034 190 lb (86.2 kg)     Height 04/04/17 1034 5\' 6"  (1.676 m)     Head Circumference --      Peak Flow --      Pain Score 04/04/17 1033 6     Pain Loc --      Pain Edu? --  Excl. in [place]? --    No data found.  Updated Vital Signs BP 98/63 (BP Location: Left Arm)   Pulse 68   Temp 97.9 F (36.6 C) (Oral)   Ht 5\' 6"  (1.676 m)   Wt 190 lb (86.2 kg)   SpO2 97%   BMI 30.67 kg/m   Visual Acuity Right Eye Distance:   Left Eye Distance:   Bilateral Distance:    Right Eye Near:   Left Eye Near:    Bilateral Near:     Physical Exam  Constitutional: He appears well-developed and well-nourished. No distress.  Musculoskeletal:       Left shoulder: He exhibits tenderness. He exhibits normal range of motion, no bony tenderness, no swelling, no effusion, no crepitus, no deformity, no laceration, no spasm and normal pulse.  Extremity neurovascularly intact; positive supraspinatus isolation test  Skin: He is not diaphoretic.  Nursing note and vitals reviewed.    UC  Treatments / Results  Labs (all labs ordered are listed, but only abnormal results are displayed) Labs Reviewed - No data to display  EKG None Radiology No results found.  Procedures Procedures (including critical care time)  Medications Ordered in UC Medications - No data to display   Initial Impression / Assessment and Plan / UC Course  I have reviewed the triage vital signs and the nursing notes.  Pertinent labs & imaging results that were available during my care of the patient were reviewed by me and considered in my medical decision making (see chart for details).       Final Clinical Impressions(s) / UC Diagnoses   Final diagnoses:  Injury of left rotator cuff, initial encounter    ED Discharge Orders    None     1. diagnosis reviewed with patient 2. Recommend supportive treatment with otc nsaids prn, rest, ice/heat, range of motion 3. Follow-up prn if symptoms worsen or don't improve  Controlled Substance Prescriptions  Controlled Substance Registry consulted? Not Applicable   Norval Gable, MD 04/04/17 1309

## 2017-04-04 NOTE — ED Triage Notes (Signed)
Patient has been experiencing left shoulder pain, sharp pain on and off x 2 months.

## 2017-06-15 ENCOUNTER — Other Ambulatory Visit: Payer: Self-pay | Admitting: Orthopedic Surgery

## 2017-06-15 DIAGNOSIS — M7502 Adhesive capsulitis of left shoulder: Secondary | ICD-10-CM

## 2017-06-15 DIAGNOSIS — M25512 Pain in left shoulder: Principal | ICD-10-CM

## 2017-06-15 DIAGNOSIS — G8929 Other chronic pain: Secondary | ICD-10-CM

## 2017-06-15 DIAGNOSIS — M25312 Other instability, left shoulder: Secondary | ICD-10-CM

## 2017-06-18 ENCOUNTER — Ambulatory Visit
Admission: RE | Admit: 2017-06-18 | Discharge: 2017-06-18 | Disposition: A | Discharge status: Home / Self Care | Payer: BLUE CROSS/BLUE SHIELD | Source: Ambulatory Visit | Attending: Orthopedic Surgery | Admitting: Orthopedic Surgery

## 2017-06-18 DIAGNOSIS — M25312 Other instability, left shoulder: Secondary | ICD-10-CM

## 2017-06-18 DIAGNOSIS — M7502 Adhesive capsulitis of left shoulder: Secondary | ICD-10-CM

## 2017-06-18 DIAGNOSIS — G8929 Other chronic pain: Secondary | ICD-10-CM

## 2017-06-18 DIAGNOSIS — M25512 Pain in left shoulder: Principal | ICD-10-CM

## 2017-11-05 ENCOUNTER — Ambulatory Visit
Admission: EM | Admit: 2017-11-05 | Discharge: 2017-11-05 | Disposition: A | Discharge status: Home / Self Care | Payer: BLUE CROSS/BLUE SHIELD | Attending: Family Medicine | Admitting: Family Medicine

## 2017-11-05 DIAGNOSIS — H60312 Diffuse otitis externa, left ear: Secondary | ICD-10-CM

## 2017-11-05 DIAGNOSIS — H6983 Other specified disorders of Eustachian tube, bilateral: Secondary | ICD-10-CM | POA: Diagnosis not present

## 2017-11-05 MED ORDER — CIPROFLOXACIN-DEXAMETHASONE 0.3-0.1 % OT SUSP: 4.0000 [drp] | Freq: Two times a day (BID) | OTIC | 0 refills | Status: DC

## 2017-11-05 NOTE — Discharge Instructions (Signed)
Use Flonase nasal spray daily for the next 2 to 3 weeks °

## 2017-11-05 NOTE — ED Triage Notes (Signed)
Pt here for left ear pain for 4 days and states his right ear is irritated. No fever reported and taking ibuprofen otc and has helped with pain.

## 2017-11-05 NOTE — ED Provider Notes (Signed)
[facility]    CSN: [id number] Arrival date & time: 11/05/17  1650     History   Chief Complaint Chief Complaint  Patient presents with  . Otalgia    HPI Micheal Yoder is a 52 y.o. male.   HPI  52 year old male presents with left ear pain that he had for 4 days.  Complains of his right ear being irritated.  Relates a upper respiratory type infection about 2 weeks ago  With continued sinus congestion to the present time.  Has had no fever or chills.  He has been taking ibuprofen over-the-counter which helps with the pain.  Ear is very tender on the exterior.  Has been placing bacitracin in the opening of the ear canal.  Not been able to clear his ears with Valsalva maneuver.  Denies any discharge.  He denies any tinnitus.         Past Medical History:  Diagnosis Date  . Arthritis    back  . Asthma   . Bronchitis 04/16/2016   only lingering cough left(04/20/16)  . Dupuytren's disease     Patient Active Problem List   Diagnosis Date Noted  . Special screening for malignant neoplasms, colon   . Benign neoplasm of descending colon   . Allergic rhinitis 04/20/2015  . Airway hyperreactivity 04/20/2015  . Esophageal reflux 04/20/2015  . Plantar fascial fibromatosis 04/20/2015  . Contracture of palmar fascia (Dupuytren's) 01/02/2015  . Family history of malignant neoplasm of prostate 08/04/2005  . Hypercholesterolemia without hypertriglyceridemia 01/12/2005    Past Surgical History:  Procedure Laterality Date  . COLONOSCOPY WITH PROPOFOL N/A 04/24/2016   Procedure: COLONOSCOPY WITH PROPOFOL;  Surgeon: Lucilla Lame, MD;  Location: [place];  Service: Endoscopy;  Laterality: N/A;  . POLYPECTOMY N/A 04/24/2016   Procedure: POLYPECTOMY;  Surgeon: Lucilla Lame, MD;  Location: [place];  Service: Endoscopy;  Laterality: N/A;  . RHINOPLASTY    . TONSILLECTOMY         Home Medications    Prior to Admission medications   Medication  Sig Start Date End Date Taking? Authorizing Provider  ibuprofen (ADVIL,MOTRIN) 600 MG tablet Take 1 tablet (600 mg total) by mouth every 6 (six) hours as needed. 10/04/16  Yes [name], MD  amoxicillin-clavulanate (AUGMENTIN) 875-125 MG tablet Take 1 tablet by mouth every 12 (twelve) hours. 11/10/16   Marylene Land, NP  ciprofloxacin-dexamethasone (CIPRODEX) OTIC suspension Place 4 drops into both ears 2 (two) times daily. 11/05/17   Lorin Picket, PA-C  Multiple Vitamin (MULTIVITAMIN) capsule Take 1 capsule by mouth daily.    [provider]  ofloxacin (FLOXIN) 0.3 % OTIC solution Place 5 drops into both ears 2 (two) times daily. X 7 days 10/04/16   [name], MD    Family History Family History  Problem Relation Age of Onset  . Emphysema Mother   . Hypertension Father     Social History Social History   Tobacco Use  . Smoking status: Never Smoker  . Smokeless tobacco: Never Used  Substance Use Topics  . Alcohol use: No  . Drug use: No     Allergies   Patient has no known allergies.   Review of Systems Review of Systems  Constitutional: Negative for activity change, appetite change, chills, fatigue and fever.  HENT: Positive for ear pain. Negative for ear discharge, hearing loss and tinnitus.   All other systems reviewed and are negative.    Physical Exam Triage Vital Signs ED  Triage Vitals  Enc Vitals Group     BP 11/05/17 1657 104/65     Pulse Rate 11/05/17 1657 67     Resp 11/05/17 1657 18     Temp 11/05/17 1657 98.2 F (36.8 C)     Temp Source 11/05/17 1657 Oral     SpO2 11/05/17 1657 97 %     Weight 11/05/17 1659 180 lb (81.6 kg)     Height --      Head Circumference --      Peak Flow --      Pain Score 11/05/17 1659 2     Pain Loc --      Pain Edu? --      Excl. in [place]? --    No data found.  Updated Vital Signs BP 104/65 (BP Location: Right Arm)   Pulse 67   Temp 98.2 F (36.8 C) (Oral)   Resp 18   Wt 180 lb (81.6  kg)   SpO2 97%   BMI 29.05 kg/m   Visual Acuity Right Eye Distance:   Left Eye Distance:   Bilateral Distance:    Right Eye Near:   Left Eye Near:    Bilateral Near:     Physical Exam  Constitutional: He appears well-developed and well-nourished. No distress.  HENT:  Head: Normocephalic.  Both ear canals are occluded with bacitracin ointment.  Is marginally cleared out with a Q-tip.  The left ear has narrowing and debris in the posterior O anterior portion of the canal.  Does have tenderness to movement of the tragus and the auricle.  T M is not visualized..  Right ear also has a moderate burden of cerumen. TM Is not well-visualized  Skin: He is not diaphoretic.  Nursing note and vitals reviewed.  After ear lavage of both ears, the right TM looked normal.  The canal was also apparently normal.  Left ear swelling of the canal only a small portion of the TM still visible because of the swelling.  Not appear to be erythematous.  UC Treatments / Results  Labs (all labs ordered are listed, but only abnormal results are displayed) Labs Reviewed - No data to display  EKG None  Radiology No results found.  Procedures Procedures (including critical care time)  Medications Ordered in UC Medications - No data to display  Initial Impression / Assessment and Plan / UC Course  I have reviewed the triage vital signs and the nursing notes.  Pertinent labs & imaging results that were available during my care of the patient were reviewed by me and considered in my medical decision making (see chart for details).     We will place the patient on Ciprodex eardrops.  Also recommended the use of Flonase for eustachian tube dysfunction.  This is likely from his recent URI.  If he is not improving I have recommended that he follow-up with ENT. Final Clinical Impressions(s) / UC Diagnoses   Final diagnoses:  Acute diffuse otitis externa of left ear  Eustachian tube dysfunction,  bilateral     Discharge Instructions     Use Flonase nasal spray daily for the next 2 to 3 weeks    ED Prescriptions    Medication Sig Dispense Auth. Provider   ciprofloxacin-dexamethasone (CIPRODEX) OTIC suspension  (Status: Discontinued) Place 4 drops into both ears 2 (two) times daily. 7.5 mL Lorin Picket, PA-C   ciprofloxacin-dexamethasone (CIPRODEX) OTIC suspension Place 4 drops into both ears 2 (two) times daily. 7.5  mL Lorin Picket, PA-C     Controlled Substance Prescriptions [place] Controlled Substance Registry consulted? Not Applicable   Lorin Picket, PA-C 11/05/17 1756

## 2019-06-09 ENCOUNTER — Ambulatory Visit
Admission: EM | Admit: 2019-06-09 | Discharge: 2019-06-09 | Disposition: A | Discharge status: Home / Self Care | Payer: No Typology Code available for payment source | Attending: Family Medicine | Admitting: Family Medicine

## 2019-06-09 ENCOUNTER — Encounter: Payer: Self-pay | Admitting: Emergency Medicine

## 2019-06-09 ENCOUNTER — Other Ambulatory Visit: Payer: Self-pay

## 2019-06-09 DIAGNOSIS — H60503 Unspecified acute noninfective otitis externa, bilateral: Secondary | ICD-10-CM

## 2019-06-09 DIAGNOSIS — H6993 Unspecified Eustachian tube disorder, bilateral: Secondary | ICD-10-CM | POA: Diagnosis not present

## 2019-06-09 DIAGNOSIS — H6123 Impacted cerumen, bilateral: Secondary | ICD-10-CM

## 2019-06-09 MED ORDER — CIPROFLOXACIN-DEXAMETHASONE 0.3-0.1 % OT SUSP
4.0000 [drp] | Freq: Two times a day (BID) | OTIC | 0 refills | Status: DC
Start: 2019-06-09 — End: 2020-11-04

## 2019-06-09 NOTE — ED Provider Notes (Signed)
[facility]    CSN: [id number] Arrival date & time: 06/09/19  [id number]      History   Chief Complaint Chief Complaint  Patient presents with  . Ear Fullness  . Otalgia    HPI Micheal Yoder is a 54 y.o. male.   HPI  54 year old male presents with bilateral ear pain particular when he wears a headset when mowing his lawn and fullness that has had for over a month.  Had no fever or chills.  Is had no tinnitus.  Does not complain of discharge.  Under a lot of stress recently his wife died of cancer and has had a lot of stress.  Lost over 20 pounds but this was planned with dietary restrictions and exercise.  I reviewed his medical records and saw this gentleman on 10/31/2017 for similar presentation.  At that time he had an external otitis.  He also was diagnosed with eustachian tube dysfunction.  This was treated and he did well.  They he is afebrile.          Past Medical History:  Diagnosis Date  . Arthritis    back  . Asthma   . Bronchitis 04/16/2016   only lingering cough left(04/20/16)  . Dupuytren's disease     Patient Active Problem List   Diagnosis Date Noted  . Special screening for malignant neoplasms, colon   . Benign neoplasm of descending colon   . Allergic rhinitis 04/20/2015  . Airway hyperreactivity 04/20/2015  . Esophageal reflux 04/20/2015  . Plantar fascial fibromatosis 04/20/2015  . Contracture of palmar fascia (Dupuytren's) 01/02/2015  . Family history of malignant neoplasm of prostate 08/04/2005  . Hypercholesterolemia without hypertriglyceridemia 01/12/2005    Past Surgical History:  Procedure Laterality Date  . COLONOSCOPY WITH PROPOFOL N/A 04/24/2016   Procedure: COLONOSCOPY WITH PROPOFOL;  Surgeon: Lucilla Lame, MD;  Location: [place];  Service: Endoscopy;  Laterality: N/A;  . POLYPECTOMY N/A 04/24/2016   Procedure: POLYPECTOMY;  Surgeon: Lucilla Lame, MD;  Location: [place];  Service: Endoscopy;   Laterality: N/A;  . RHINOPLASTY    . TONSILLECTOMY         Home Medications    Prior to Admission medications   Medication Sig Start Date End Date Taking? Authorizing Provider  Multiple Vitamin (MULTIVITAMIN) capsule Take 1 capsule by mouth daily.   Yes [provider]  ciprofloxacin-dexamethasone (CIPRODEX) OTIC suspension Place 4 drops into both ears 2 (two) times daily. 06/09/19   Lorin Picket, PA-C  ibuprofen (ADVIL,MOTRIN) 600 MG tablet Take 1 tablet (600 mg total) by mouth every 6 (six) hours as needed. 10/04/16   [name], MD    Family History Family History  Problem Relation Age of Onset  . Emphysema Mother   . Hypertension Father     Social History Social History   Tobacco Use  . Smoking status: Never Smoker  . Smokeless tobacco: Never Used  Substance Use Topics  . Alcohol use: No  . Drug use: No     Allergies   Patient has no known allergies.   Review of Systems Review of Systems  Constitutional: Positive for activity change. Negative for appetite change, chills, diaphoresis, fatigue and fever.  HENT: Positive for ear pain. Negative for ear discharge, postnasal drip, rhinorrhea, sinus pressure and sinus pain.   All other systems reviewed and are negative.    Physical Exam Triage Vital Signs ED Triage Vitals  Enc Vitals Group     BP 06/09/19  0825 100/62     Pulse Rate 06/09/19 0825 60     Resp 06/09/19 0825 16     Temp 06/09/19 0825 98.2 F (36.8 C)     Temp Source 06/09/19 0825 Oral     SpO2 06/09/19 0825 100 %     Weight 06/09/19 0824 158 lb (71.7 kg)     Height 06/09/19 0824 5\' 6"  (1.676 m)     Head Circumference --      Peak Flow --      Pain Score 06/09/19 0824 0     Pain Loc --      Pain Edu? --      Excl. in [place]? --    No data found.  Updated Vital Signs BP 100/62 (BP Location: Left Arm)   Pulse 60   Temp 98.2 F (36.8 C) (Oral)   Resp 16   Ht 5\' 6"  (1.676 m)   Wt 158 lb (71.7 kg)   SpO2 100%   BMI  25.50 kg/m   Visual Acuity Right Eye Distance:   Left Eye Distance:   Bilateral Distance:    Right Eye Near:   Left Eye Near:    Bilateral Near:     Physical Exam Vitals and nursing note reviewed.  Constitutional:      General: He is not in acute distress.    Appearance: Normal appearance. He is normal weight. He is not ill-appearing, toxic-appearing or diaphoretic.  HENT:     Head: Normocephalic and atraumatic.     Right Ear: External ear normal. There is impacted cerumen.     Left Ear: External ear normal. There is impacted cerumen.     Nose: Nose normal.     Mouth/Throat:     Mouth: Mucous membranes are moist.     Pharynx: Oropharynx is clear.  Eyes:     Conjunctiva/sclera: Conjunctivae normal.  Musculoskeletal:        General: Normal range of motion.     Cervical back: Normal range of motion and neck supple.  Skin:    General: Skin is warm and dry.  Neurological:     General: No focal deficit present.     Mental Status: He is alert and oriented to person, place, and time.  Psychiatric:        Mood and Affect: Mood normal.        Behavior: Behavior normal.        Thought Content: Thought content normal.        Judgment: Judgment normal.      UC Treatments / Results  Labs (all labs ordered are listed, but only abnormal results are displayed) Labs Reviewed - No data to display  EKG   Radiology No results found.  Procedures Procedures (including critical care time)  Medications Ordered in UC Medications - No data to display  Initial Impression / Assessment and Plan / UC Course  I have reviewed the triage vital signs and the nursing notes.  Pertinent labs & imaging results that were available during my care of the patient were reviewed by me and considered in my medical decision making (see chart for details).   54 year old male presents with bilateral ear pain and fullness that has had off and on for a month.  He denies any fevers or chills.  In  review  of his medical records he was seen by me in October 2019 very similar presentation.  Examination revealed impacted cerumen bilaterally worse on the left.  He  was irrigated bilaterally with the removal of the majority of the cerumen burden.  Lamination showed irritated canal from the irrigation.  It was not narrowed.  The atriums appeared normal on the right and slightly dull on the left.  I placed him on Ciprodex because of the irritation.  He will use this for approximately 5 days and discontinue its use.  As of the eustachian tube dysfunction I will also recommend that he start using Flonase nasal spray on a daily basis.  May consider the use of Nettie pot as part of the daily regimen as well.  If he is not improving I recommended following up with ear nose and throat or his primary care physician  Final Clinical Impressions(s) / UC Diagnoses   Final diagnoses:  Bilateral impacted cerumen  Eustachian tube disorder, bilateral  Acute otitis externa of both ears, unspecified type     Discharge Instructions     Your ear canals are irritated from the washing so use the Ciprodex for approximately 5 days and then discontinue.  Flonase nasal spray (OTC) daily for 1 month.  If not improving follow-up with your primary care physician.    ED Prescriptions    Medication Sig Dispense Auth. Provider   ciprofloxacin-dexamethasone (CIPRODEX) OTIC suspension Place 4 drops into both ears 2 (two) times daily. 7.5 mL Lorin Picket, PA-C     PDMP not reviewed this encounter.   Lorin Picket, PA-C 06/09/19 1954

## 2019-06-09 NOTE — ED Triage Notes (Signed)
Patient c/o bilateral ear pain and fullness off and on for a month.  Patient denies fevers.

## 2019-06-09 NOTE — Discharge Instructions (Addendum)
Your ear canals are irritated from the washing so use the Ciprodex for approximately 5 days and then discontinue.  Flonase nasal spray (OTC) daily for 1 month.  If not improving follow-up with your primary care physician.

## 2019-11-16 ENCOUNTER — Other Ambulatory Visit: Payer: Self-pay | Admitting: Orthopedic Surgery

## 2019-11-16 ENCOUNTER — Other Ambulatory Visit (HOSPITAL_COMMUNITY): Payer: Self-pay | Admitting: Orthopedic Surgery

## 2019-11-16 DIAGNOSIS — M25511 Pain in right shoulder: Secondary | ICD-10-CM

## 2019-11-28 ENCOUNTER — Ambulatory Visit
Admission: RE | Admit: 2019-11-28 | Discharge: 2019-11-28 | Disposition: A | Discharge status: Home / Self Care | Payer: No Typology Code available for payment source | Source: Ambulatory Visit | Attending: Orthopedic Surgery | Admitting: Orthopedic Surgery

## 2019-11-28 ENCOUNTER — Other Ambulatory Visit: Payer: Self-pay

## 2019-11-28 DIAGNOSIS — M25511 Pain in right shoulder: Secondary | ICD-10-CM | POA: Insufficient documentation

## 2020-11-04 ENCOUNTER — Encounter: Payer: Self-pay | Admitting: Emergency Medicine

## 2020-11-04 ENCOUNTER — Ambulatory Visit: Payer: Self-pay

## 2020-11-04 ENCOUNTER — Ambulatory Visit
Admission: EM | Admit: 2020-11-04 | Discharge: 2020-11-04 | Disposition: A | Discharge status: Home / Self Care | Payer: No Typology Code available for payment source

## 2020-11-04 ENCOUNTER — Other Ambulatory Visit: Payer: Self-pay

## 2020-11-04 DIAGNOSIS — M79662 Pain in left lower leg: Secondary | ICD-10-CM | POA: Diagnosis not present

## 2020-11-04 NOTE — Discharge Instructions (Signed)
You need to get an ultrasound done which we cant do that here today to rule out a blood clot.

## 2020-11-04 NOTE — ED Triage Notes (Signed)
Pt presents today with left calf pain x 2 weeks. Denies injury.

## 2020-11-04 NOTE — ED Provider Notes (Signed)
[facility]    CSN: [id number] Arrival date & time: 11/04/20  1844      History   Chief Complaint Chief Complaint  Patient presents with   Leg Pain    Left calf    HPI Micheal Yoder is a 55 y.o. male who presents with L calf pain x 2 week. Denies injuring himself. Pain is worse to sit and when he bends his knee like about to squat. Walking helps and does not hurt as much. Does not bother him at all when he lays down. But when sitting or standing hurts more. Denies hx of DVT    Past Medical History:  Diagnosis Date   Arthritis    back   Asthma    Bronchitis 04/16/2016   only lingering cough left(04/20/16)   Dupuytren's disease     Patient Active Problem List   Diagnosis Date Noted   Special screening for malignant neoplasms, colon    Benign neoplasm of descending colon    Allergic rhinitis 04/20/2015   Airway hyperreactivity 04/20/2015   Esophageal reflux 04/20/2015   Plantar fascial fibromatosis 04/20/2015   Contracture of palmar fascia (Dupuytren's) 01/02/2015   Family history of malignant neoplasm of prostate 08/04/2005   Hypercholesterolemia without hypertriglyceridemia 01/12/2005    Past Surgical History:  Procedure Laterality Date   COLONOSCOPY WITH PROPOFOL N/A 04/24/2016   Procedure: COLONOSCOPY WITH PROPOFOL;  Surgeon: Lucilla Lame, MD;  Location: [place];  Service: Endoscopy;  Laterality: N/A;   POLYPECTOMY N/A 04/24/2016   Procedure: POLYPECTOMY;  Surgeon: Lucilla Lame, MD;  Location: [place];  Service: Endoscopy;  Laterality: N/A;   RHINOPLASTY     TONSILLECTOMY         Home Medications    Prior to Admission medications   Not on File    Family History Family History  Problem Relation Age of Onset   Emphysema Mother    Hypertension Father     Social History Social History   Tobacco Use   Smoking status: Never   Smokeless tobacco: Never  Vaping Use   Vaping Use: Never used  Substance Use Topics    Alcohol use: No   Drug use: No     Allergies   Patient has no known allergies.   Review of Systems Review of Systems  Musculoskeletal:        L calf pain and swelling   Skin:  Negative for rash and wound.    Physical Exam Triage Vital Signs ED Triage Vitals  Enc Vitals Group     BP 11/04/20 1945 108/72     Pulse Rate 11/04/20 1943 70     Resp 11/04/20 1943 18     Temp 11/04/20 1943 98.6 F (37 C)     Temp Source 11/04/20 1943 Oral     SpO2 11/04/20 1943 99 %     Weight --      Height --      Head Circumference --      Peak Flow --      Pain Score 11/04/20 1941 1     Pain Loc --      Pain Edu? --      Excl. in [place]? --    No data found.  Updated Vital Signs BP 108/72 (BP Location: Right Arm)   Pulse 70   Temp 98.6 F (37 C) (Oral)   Resp 18   SpO2 99%   Visual Acuity Right Eye Distance:   Left  Eye Distance:   Bilateral Distance:    Right Eye Near:   Left Eye Near:    Bilateral Near:     Physical Exam Vitals and nursing note reviewed.  Constitutional:      General: He is not in acute distress.    Appearance: He is not toxic-appearing.  Eyes:     General: No scleral icterus.    Conjunctiva/sclera: Conjunctivae normal.  Musculoskeletal:        General: Normal range of motion.     Cervical back: Neck supple.     Comments: L calf is larger than the L and is tender in the cental area, but I dont feel any cords.  Has a varicose vein which is soft and not tender.   Skin:    General: Skin is warm and dry.     Findings: No rash.  Neurological:     Mental Status: He is alert and oriented to person, place, and time.     Gait: Gait normal.  Psychiatric:        Mood and Affect: Mood normal.        Behavior: Behavior normal.        Thought Content: Thought content normal.        Judgment: Judgment normal.     UC Treatments / Results  Labs (all labs ordered are listed, but only abnormal results are displayed) Labs Reviewed - No data to  display  EKG   Radiology No results found.  Procedures Procedures (including critical care time)  Medications Ordered in UC Medications - No data to display  Initial Impression / Assessment and Plan / UC Course  I have reviewed the triage vital signs and the nursing notes. Needs to go to ER for [place] of lower leg to r/o DVT, pt will go to [facility] ER tonight      Final Clinical Impressions(s) / UC Diagnoses   Final diagnoses:  Pain of left calf     Discharge Instructions      You need to get an ultrasound done which we cant do that here today to rule out a blood clot.      ED Prescriptions   None    PDMP not reviewed this encounter.   Shelby Mattocks, Hershal Coria 11/04/20 2004

## 2021-12-15 ENCOUNTER — Ambulatory Visit (INDEPENDENT_AMBULATORY_CARE_PROVIDER_SITE_OTHER): Payer: No Typology Code available for payment source | Admitting: Dermatology

## 2021-12-15 VITALS — BP 117/69 | HR 73

## 2021-12-15 DIAGNOSIS — Z1283 Encounter for screening for malignant neoplasm of skin: Secondary | ICD-10-CM

## 2021-12-15 DIAGNOSIS — L814 Other melanin hyperpigmentation: Secondary | ICD-10-CM | POA: Diagnosis not present

## 2021-12-15 DIAGNOSIS — L821 Other seborrheic keratosis: Secondary | ICD-10-CM

## 2021-12-15 DIAGNOSIS — B079 Viral wart, unspecified: Secondary | ICD-10-CM

## 2021-12-15 DIAGNOSIS — L578 Other skin changes due to chronic exposure to nonionizing radiation: Secondary | ICD-10-CM | POA: Diagnosis not present

## 2021-12-15 DIAGNOSIS — I781 Nevus, non-neoplastic: Secondary | ICD-10-CM

## 2021-12-15 DIAGNOSIS — D229 Melanocytic nevi, unspecified: Secondary | ICD-10-CM

## 2021-12-15 DIAGNOSIS — D239 Other benign neoplasm of skin, unspecified: Secondary | ICD-10-CM

## 2021-12-15 DIAGNOSIS — Z7189 Other specified counseling: Secondary | ICD-10-CM

## 2021-12-15 DIAGNOSIS — D2372 Other benign neoplasm of skin of left lower limb, including hip: Secondary | ICD-10-CM

## 2021-12-15 NOTE — Progress Notes (Signed)
New Patient Visit  Subjective  Micheal Yoder is a 56 y.o. male who presents for the following: Annual Exam (Scattered irregular skin lesions on the trunk and extremities that patient would like checked today). The patient presents for Total-Body Skin Exam (TBSE) for skin cancer screening and mole check.  The patient has spots, moles and lesions to be evaluated, some may be new or changing and the patient has concerns that these could be cancer.  The following portions of the chart were reviewed this encounter and updated as appropriate:   Tobacco  Allergies  Meds  Problems  Med Hx  Surg Hx  Fam Hx     Review of Systems:  No other skin or systemic complaints except as noted in HPI or Assessment and Plan.  Objective  Well appearing patient in no apparent distress; mood and affect are within normal limits.  A full examination was performed including scalp, head, eyes, ears, nose, lips, neck, chest, axillae, abdomen, back, buttocks, bilateral upper extremities, bilateral lower extremities, hands, feet, fingers, toes, fingernails, and toenails. All findings within normal limits unless otherwise noted below.  R med canthus Dilated vessel.  L palm x 1 Verrucous papules -- Discussed viral etiology and contagion.    Assessment & Plan  Telangiectasia R med canthus Benign, observe.  Discussed the treatment option of BBL/laser.  Typically we recommend 1-3 treatment sessions about 5-8 weeks apart for best results.  The patient's condition may require "maintenance treatments" in the future.  The fee for BBL / laser treatments is $350 per treatment session for the whole face.  A fee can be quoted for other parts of the body. Insurance typically does not pay for BBL/laser treatments and therefore the fee is an out-of-pocket cost.  Viral warts, unspecified type L palm x 1 Viral Wart (HPV) Counseling  Discussed viral / HPV (Human Papilloma Virus) etiology and risk of spread /infectivity  to other areas of body as well as to other people.  Multiple treatments and methods may be required to clear warts and it is possible treatment may not be successful.  Treatment risks include discoloration; scarring and there is still potential for wart recurrence.  Destruction of lesion - L palm x 1 Complexity: simple   Destruction method: cryotherapy   Informed consent: discussed and consent obtained   Timeout:  patient name, date of birth, surgical site, and procedure verified Lesion destroyed using liquid nitrogen: Yes   Region frozen until ice ball extended beyond lesion: Yes   Outcome: patient tolerated procedure well with no complications   Post-procedure details: wound care instructions given    Lentigines - Scattered tan macules - Due to sun exposure - Benign-appearing, observe - Recommend daily broad spectrum sunscreen SPF 30+ to sun-exposed areas, reapply every 2 hours as needed. - Call for any changes  Seborrheic Keratoses - Stuck-on, waxy, tan-brown papules and/or plaques  - Benign-appearing - Discussed benign etiology and prognosis. - Observe - Call for any changes  Melanocytic Nevi - Tan-brown and/or pink-flesh-colored symmetric macules and papules - Benign appearing on exam today - Observation - Call clinic for new or changing moles - Recommend daily use of broad spectrum spf 30+ sunscreen to sun-exposed areas.   Hemangiomas - Red papules - Discussed benign nature - Observe - Call for any changes  Actinic Damage - Chronic condition, secondary to cumulative UV/sun exposure - diffuse scaly erythematous macules with underlying dyspigmentation - Recommend daily broad spectrum sunscreen SPF 30+ to sun-exposed areas, reapply  every 2 hours as needed.  - Staying in the shade or wearing long sleeves, sun glasses (UVA+UVB protection) and wide brim hats (4-inch brim around the entire circumference of the hat) are also recommended for sun protection.  - Call for new or  changing lesions.  Dermatofibroma - L pretibial x 2 - Firm pink/brown papulenodule with dimple sign - Benign appearing - Call for any changes  Skin cancer screening performed today.  Return in about 1 year (around 12/16/2022) for TBSE.  Luther Redo, CMA, am acting as scribe for Sarina Ser, MD . Documentation: I have reviewed the above documentation for accuracy and completeness, and I agree with the above.  Sarina Ser, MD

## 2021-12-15 NOTE — Patient Instructions (Signed)
Due to recent changes in healthcare laws, you may see results of your pathology and/or laboratory studies on MyChart before the doctors have had a chance to review them. We understand that in some cases there may be results that are confusing or concerning to you. Please understand that not all results are received at the same time and often the doctors may need to interpret multiple results in order to provide you with the best plan of care or course of treatment. Therefore, we ask that you please give us 2 business days to thoroughly review all your results before contacting the office for clarification. Should we see a critical lab result, you will be contacted sooner.   If You Need Anything After Your Visit  If you have any questions or concerns for your doctor, please call our main line at [phone] and press option 4 to reach your doctor's medical assistant. If no one answers, please leave a voicemail as directed and we will return your call as soon as possible. Messages left after 4 pm will be answered the following business day.   You may also send us a message via MyChart. We typically respond to MyChart messages within 1-2 business days.  For prescription refills, please ask your pharmacy to contact our office. Our fax number is [phone].  If you have an urgent issue when the clinic is closed that cannot wait until the next business day, you can page your doctor at the number below.    Please note that while we do our best to be available for urgent issues outside of office hours, we are not available 24/7.   If you have an urgent issue and are unable to reach us, you may choose to seek medical care at your doctor's office, retail clinic, urgent care center, or emergency room.  If you have a medical emergency, please immediately call 911 or go to the emergency department.  Pager Numbers  - Dr. Kowalski: [phone]  - Dr. Moye: [phone]  - Dr. Stewart:  [phone]  In the event of inclement weather, please call our main line at [phone] for an update on the status of any delays or closures.  Dermatology Medication Tips: Please keep the boxes that topical medications come in in order to help keep track of the instructions about where and how to use these. Pharmacies typically print the medication instructions only on the boxes and not directly on the medication tubes.   If your medication is too expensive, please contact our office at [phone] option 4 or send us a message through MyChart.   We are unable to tell what your co-pay for medications will be in advance as this is different depending on your insurance coverage. However, we may be able to find a substitute medication at lower cost or fill out paperwork to get insurance to cover a needed medication.   If a prior authorization is required to get your medication covered by your insurance company, please allow us 1-2 business days to complete this process.  Drug prices often vary depending on where the prescription is filled and some pharmacies may offer cheaper prices.  The website www.goodrx.com contains coupons for medications through different pharmacies. The prices here do not account for what the cost may be with help from insurance (it may be cheaper with your insurance), but the website can give you the price if you did not use any insurance.  - You can print the associated coupon and take it with   your prescription to the pharmacy.  - You may also stop by our office during regular business hours and pick up a GoodRx coupon card.  - If you need your prescription sent electronically to a different pharmacy, notify our office through [facility] MyChart or by phone at [phone] option 4.     Si Usted Necesita Algo Despus de Su Visita  Tambin puede enviarnos un mensaje a travs de MyChart. Por lo general respondemos a los mensajes de MyChart en el transcurso de 1 a 2  das hbiles.  Para renovar recetas, por favor pida a su farmacia que se ponga en contacto con nuestra oficina. Nuestro nmero de fax es el [phone].  Si tiene un asunto urgente cuando la clnica est cerrada y que no puede esperar hasta el siguiente da hbil, puede llamar/localizar a su doctor(a) al nmero que aparece a continuacin.   Por favor, tenga en cuenta que aunque hacemos todo lo posible para estar disponibles para asuntos urgentes fuera del horario de oficina, no estamos disponibles las 24 horas del da, los 7 das de la semana.   Si tiene un problema urgente y no puede comunicarse con nosotros, puede optar por buscar atencin mdica  en el consultorio de su doctor(a), en una clnica privada, en un centro de atencin urgente o en una sala de emergencias.  Si tiene una emergencia mdica, por favor llame inmediatamente al 911 o vaya a la sala de emergencias.  Nmeros de bper  - Dr. Kowalski: [phone]  - Dra. Moye: [phone]  - Dra. Stewart: [phone]  En caso de inclemencias del tiempo, por favor llame a nuestra lnea principal al [phone] para una actualizacin sobre el estado de cualquier retraso o cierre.  Consejos para la medicacin en dermatologa: Por favor, guarde las cajas en las que vienen los medicamentos de uso tpico para ayudarle a seguir las instrucciones sobre dnde y cmo usarlos. Las farmacias generalmente imprimen las instrucciones del medicamento slo en las cajas y no directamente en los tubos del medicamento.   Si su medicamento es muy caro, por favor, pngase en contacto con nuestra oficina llamando al [phone] y presione la opcin 4 o envenos un mensaje a travs de MyChart.   No podemos decirle cul ser su copago por los medicamentos por adelantado ya que esto es diferente dependiendo de la cobertura de su seguro. Sin embargo, es posible que podamos encontrar un medicamento sustituto a menor costo o llenar un formulario para que el  seguro cubra el medicamento que se considera necesario.   Si se requiere una autorizacin previa para que su compaa de seguros cubra su medicamento, por favor permtanos de 1 a 2 das hbiles para completar este proceso.  Los precios de los medicamentos varan con frecuencia dependiendo del lugar de dnde se surte la receta y alguna farmacias pueden ofrecer precios ms baratos.  El sitio web www.goodrx.com tiene cupones para medicamentos de diferentes farmacias. Los precios aqu no tienen en cuenta lo que podra costar con la ayuda del seguro (puede ser ms barato con su seguro), pero el sitio web puede darle el precio si no utiliz ningn seguro.  - Puede imprimir el cupn correspondiente y llevarlo con su receta a la farmacia.  - Tambin puede pasar por nuestra oficina durante el horario de atencin regular y recoger una tarjeta de cupones de GoodRx.  - Si necesita que su receta se enve electrnicamente a una farmacia diferente, informe a nuestra oficina a travs de MyChart de [facility]   o por telfono llamando al [phone] y presione la opcin 4.  

## 2021-12-28 ENCOUNTER — Encounter: Payer: Self-pay | Admitting: Dermatology

## 2022-05-21 IMAGING — MR MR SHOULDER*R* W/O CM
4 of 5 series · 32 of 40 positions shown · non-contrast
Comparison: None.

CLINICAL DATA: Right shoulder pain. Limited range of motion.
Symptoms for 18 months.

EXAM:
MRI OF THE RIGHT SHOULDER WITHOUT CONTRAST
TECHNIQUE: Multiplanar, multisequence MR imaging of the shoulder was performed.
No intravenous contrast was administered.

[Series 5: T2 fat-sat · axial · right · 4.0mm · 0.44mm/px · z∈[-36,+82]mm · 8 of 26 slices shown (1 of 3)]
[im 1/26]
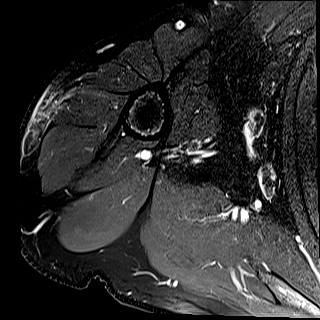
[im 4/26]
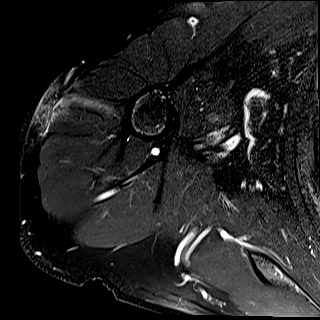
[im 8/26]
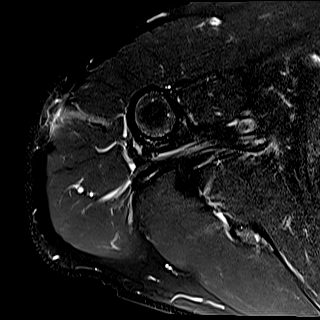
[im 11/26]
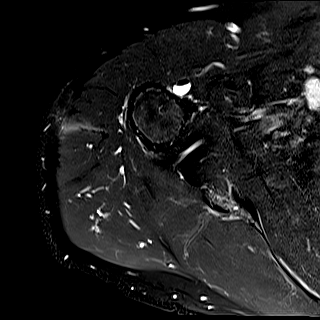
[im 15/26]
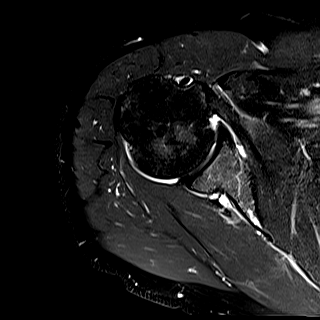
[im 18/26]
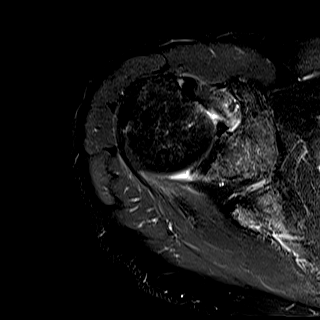
[im 22/26]
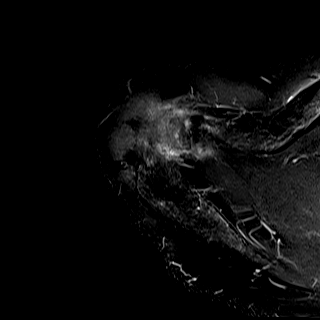
[im 26/26]
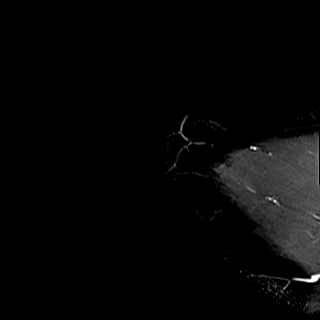

[Series 6: PD · coronal · right · 4.0mm · 0.44mm/px · 9 of 26 slices shown]
[im 1/26]
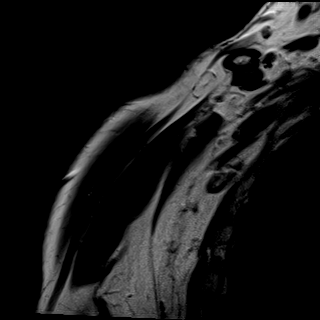
[im 4/26]
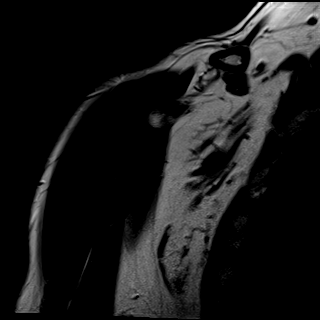
[im 7/26]
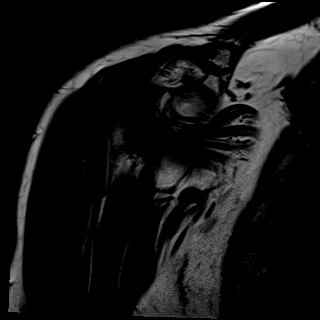
[im 10/26]
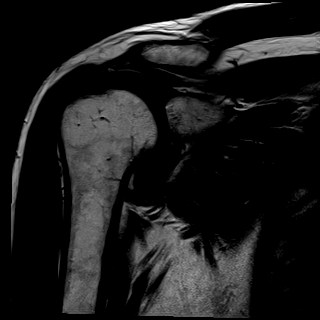
[im 13/26]
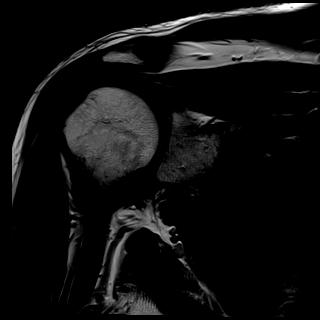
[im 16/26]
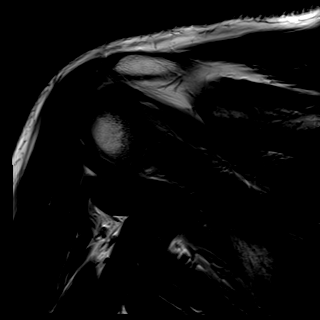
[im 19/26]
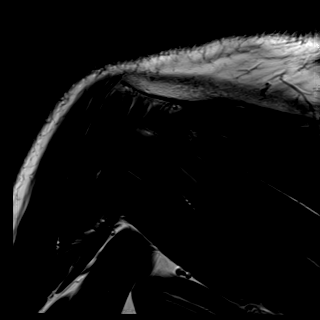
[im 22/26]
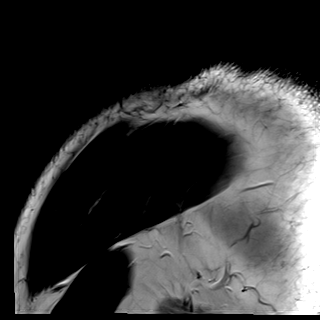
[im 26/26]
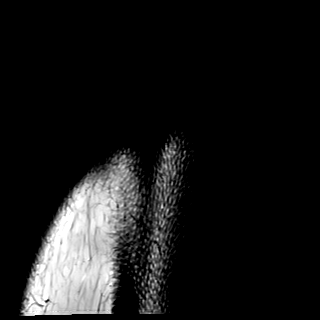

[Series 7: T2 fat-sat · coronal · right · 4.0mm · 0.44mm/px · 9 of 26 slices shown (2 of 3)]
[im 1/26]
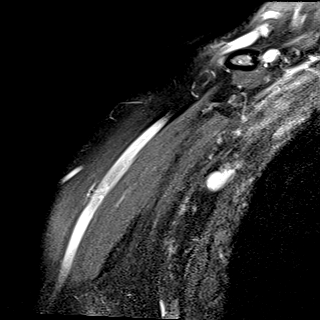
[im 4/26]
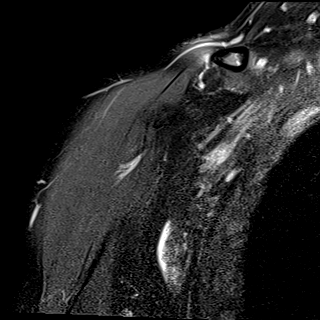
[im 7/26]
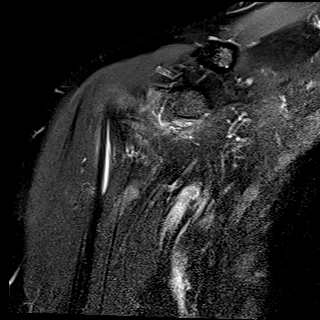
[im 10/26]
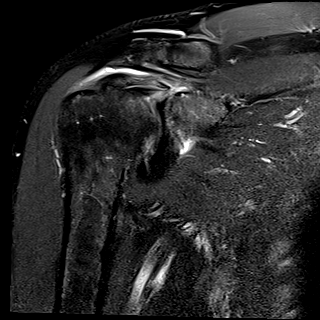
[im 13/26]
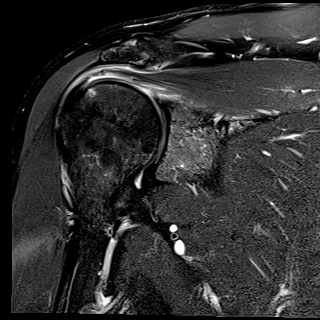
[im 16/26]
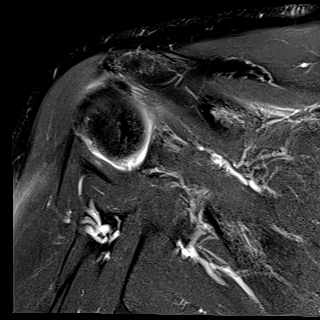
[im 19/26]
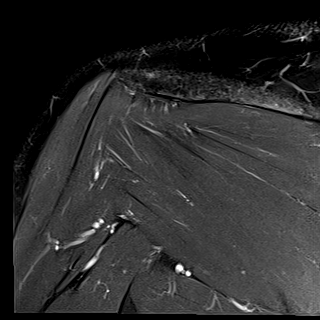
[im 22/26]
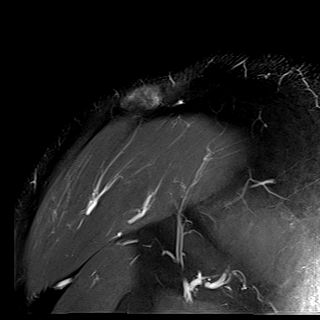
[im 26/26]
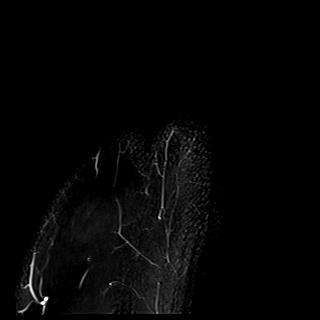

[Series 8: T2 fat-sat · sagittal · right · 4.0mm · 0.23mm/px · 6 of 22 slices shown (3 of 3)]
[im 1/22]
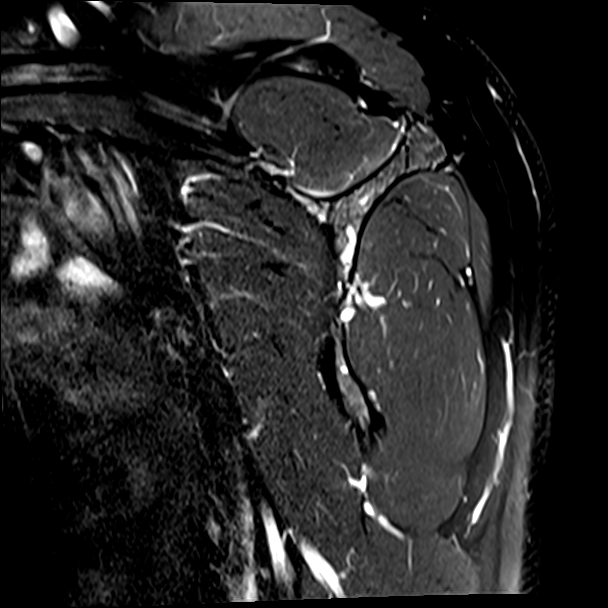
[im 4/22]
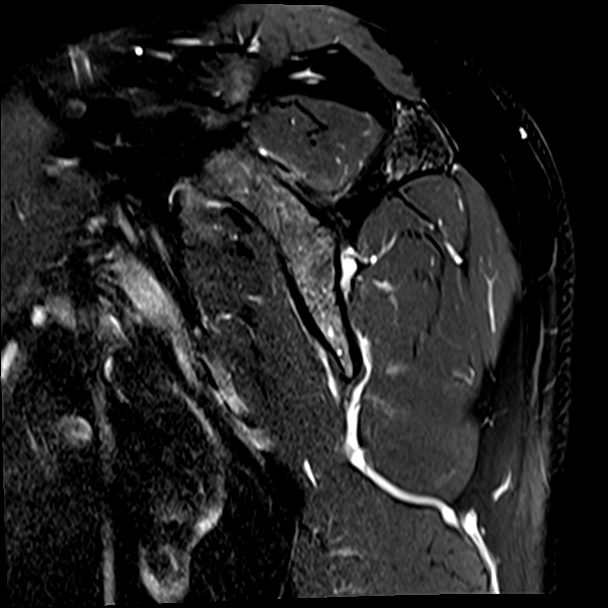
[im 8/22]
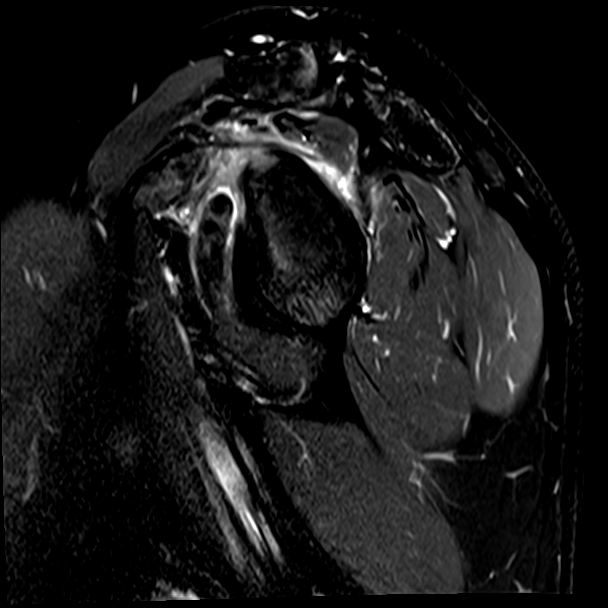
[im 11/22]
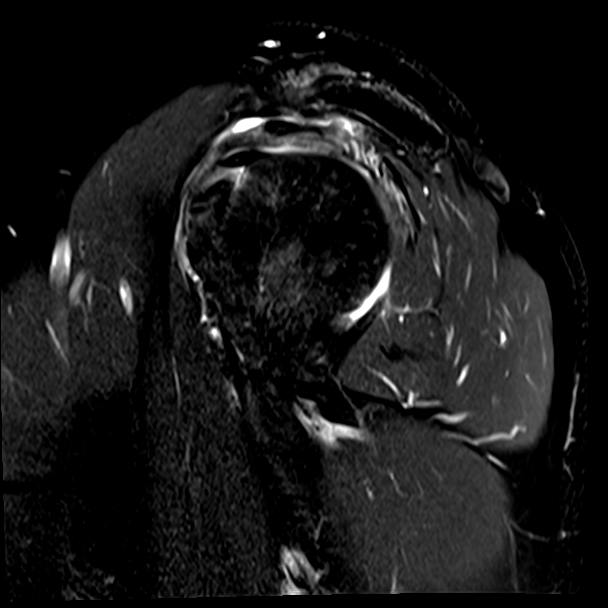
[im 15/22]
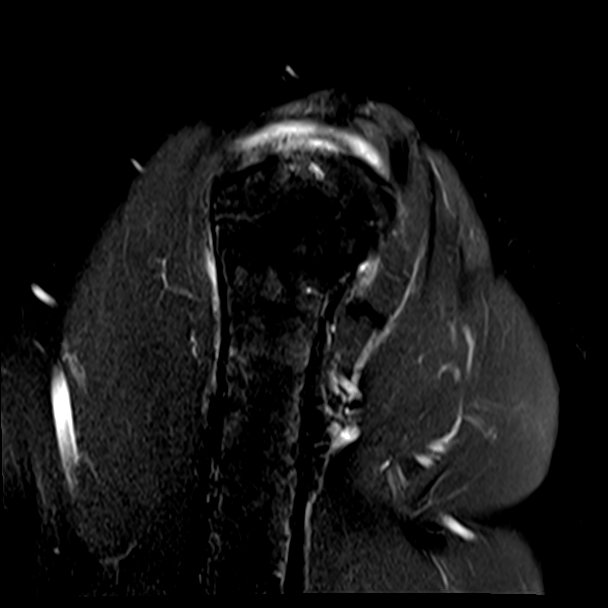
[im 18/22]
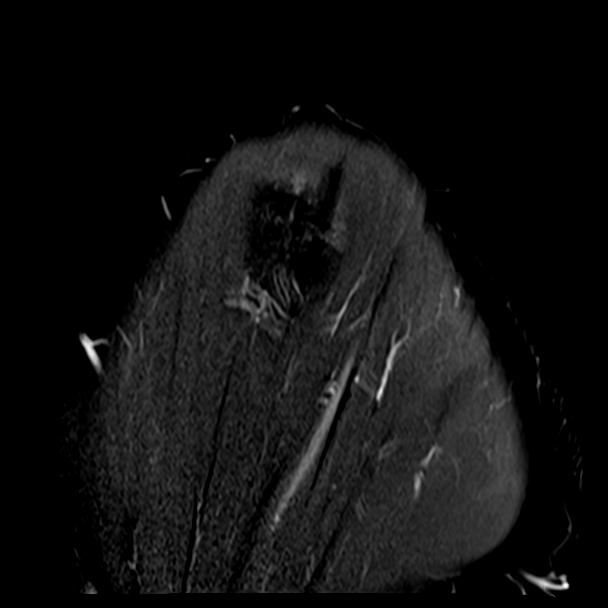

[32 of 40 positions shown; findings below may reference images not displayed]

FINDINGS: Rotator cuff: Partial thickness articular surface tearing of the
supraspinatus tendon on image 14 of series 7. Moderate supraspinatus
with mild subscapularis and infraspinatus tendinopathy. No
full-thickness rotator cuff tear is identified.

Muscles: There is a vertical band of edema in the lateral deltoid
muscle for example on image 20 of series 5. Possibilities include
deltoid muscle strain or recent intramuscular injection.

Biceps long head:  Unremarkable

Acromioclavicular Joint: Mild degenerative AC joint spurring. Type
III acromion. Mild subacromial subdeltoid bursitis.

Glenohumeral Joint: Synovitis in the rotator interval. Somewhat
indistinct middle glenohumeral ligament, probably incidental.

Labrum:  Grossly unremarkable

Bones: No significant extra-articular osseous abnormalities
identified.

Other: No supplemental non-categorized findings.
IMPRESSION: 1. Partial thickness articular surface tearing of the supraspinatus
tendon. Moderate supraspinatus with mild subscapularis and
infraspinatus tendinopathy. No full-thickness rotator cuff tear
identified.
2. Synovitis in the rotator interval. This can be associated with
adhesive capsulitis.
3. Mild subacromial subdeltoid bursitis.
4. Type III acromion with mild degenerative AC joint spurring.
5. There is a vertical band of edema in the lateral deltoid muscle.
Possibilities include deltoid muscle strain or recent intramuscular
injection.
